# Patient Record
Sex: Female | Born: 1975 | Race: White | Hispanic: No | Marital: Married | Smoking: Former smoker
Health system: Southern US, Community
[De-identification: ages and names within clinical notes are randomized; demographics above are authoritative.]

## PROBLEM LIST (undated history)

## (undated) DIAGNOSIS — M1990 Unspecified osteoarthritis, unspecified site: Secondary | ICD-10-CM

## (undated) DIAGNOSIS — M5126 Other intervertebral disc displacement, lumbar region: Secondary | ICD-10-CM

## (undated) DIAGNOSIS — M51369 Other intervertebral disc degeneration, lumbar region without mention of lumbar back pain or lower extremity pain: Secondary | ICD-10-CM

## (undated) DIAGNOSIS — M5136 Other intervertebral disc degeneration, lumbar region: Secondary | ICD-10-CM

## (undated) DIAGNOSIS — N649 Disorder of breast, unspecified: Secondary | ICD-10-CM

## (undated) DIAGNOSIS — R51 Headache: Secondary | ICD-10-CM

## (undated) DIAGNOSIS — G589 Mononeuropathy, unspecified: Secondary | ICD-10-CM

## (undated) HISTORY — DX: Other intervertebral disc degeneration, lumbar region: M51.36

## (undated) HISTORY — PX: BREAST SURGERY: SHX581

## (undated) HISTORY — PX: OTHER SURGICAL HISTORY: SHX169

## (undated) HISTORY — PX: BREAST ENHANCEMENT SURGERY: SHX7

## (undated) HISTORY — PX: HAND RECONSTRUCTION: SHX1730

## (undated) HISTORY — DX: Disorder of breast, unspecified: N64.9

## (undated) HISTORY — PX: EXTERNAL EAR SURGERY: SHX627

## (undated) HISTORY — DX: Other intervertebral disc displacement, lumbar region: M51.26

## (undated) HISTORY — DX: Other intervertebral disc degeneration, lumbar region without mention of lumbar back pain or lower extremity pain: M51.369

## (undated) HISTORY — DX: Headache: R51

## (undated) HISTORY — DX: Mononeuropathy, unspecified: G58.9

---

## 2002-04-23 ENCOUNTER — Ambulatory Visit (HOSPITAL_COMMUNITY): Admission: RE | Admit: 2002-04-23 | Discharge: 2002-04-23 | Payer: Self-pay | Admitting: Obstetrics and Gynecology

## 2002-04-23 ENCOUNTER — Encounter: Payer: Self-pay | Admitting: Obstetrics and Gynecology

## 2003-04-24 ENCOUNTER — Ambulatory Visit (HOSPITAL_COMMUNITY): Admission: RE | Admit: 2003-04-24 | Discharge: 2003-04-24 | Payer: Self-pay | Admitting: Obstetrics and Gynecology

## 2006-04-09 ENCOUNTER — Inpatient Hospital Stay (HOSPITAL_COMMUNITY): Admission: RE | Admit: 2006-04-09 | Discharge: 2006-04-11 | Payer: Self-pay | Admitting: Obstetrics and Gynecology

## 2007-12-04 ENCOUNTER — Other Ambulatory Visit: Admission: RE | Admit: 2007-12-04 | Discharge: 2007-12-04 | Payer: Self-pay | Admitting: Obstetrics and Gynecology

## 2010-06-05 ENCOUNTER — Encounter: Payer: Self-pay | Admitting: Family Medicine

## 2010-06-05 ENCOUNTER — Encounter: Payer: Self-pay | Admitting: Obstetrics and Gynecology

## 2010-09-30 NOTE — Op Note (Signed)
NAME:  Natasha King, Natasha King NO.:  [id number]   MEDICAL RECORD NO.:  [id number]          PATIENT TYPE:  INP   LOCATION:  A412                          FACILITY:  [facility]   PHYSICIAN:  Tilda Burrow, M.D. DATE OF BIRTH:  [birth year]   DATE OF PROCEDURE:  DATE OF DISCHARGE:                               OPERATIVE REPORT   PREOPERATIVE DIAGNOSES:  Pregnancy 39 weeks, repeat cesarean section,  not for trial of labor.   POSTOPERATIVE DIAGNOSES:  Pregnancy 39 weeks, repeat cesarean section,  not for trial of labor.   OPERATION:  Repeat low transverse cervical cesarean section.   SURGEON:  Tilda Burrow, MD.   ASSISTANT:  __________ .   ANESTHESIA:  Spinal, __________ CRNA.   COMPLICATIONS:  None.   FINDINGS:  Healthy infant, Apgars 9 and 9.   INDICATIONS:  The patient is a 35 year old female with prior cesarean  section for fetal distress admitted for repeat cesarean section after  term gestation. Notable for normal level 2 scan at [facility].   PROCEDURE:  The patient was taken to the operating room and prepped and  draped for lower abdominal surgery. A Pfannenstiel incision was repeated  with excision of the old cicatrix and transverse opening of the fascia  in a standard Pfannenstiel technique. There was lots of firm fibrosis  from the fascia to the muscles underneath and this required careful  dissection. The rectus muscles could be split in the midline. Peritoneum  was opened without difficulty. The bladder flap was multiply adherent to  the lower uterine segment and was able to be taken down sharply and  normal anatomy restored. The bladder flap was then developed and  transverse uterine incision performed. This extended laterally using  finger traction. The fetal vertex was guided through the incision using  vacuum extractor and fundal pressure applied. The right arm was  delivered first, no traction on the head was performed after the  delivery of the head.  The cord was clamped and the infant placed under  Dr. Samul Dada care. See his notes for further details. Apgars 9 and 9  were assigned. Post delivery of the infant, we obtained cord blood gases  and delivered the placenta using Crede massage of the uterus and then  closed the uterus after antibiotic irrigation of the internal uterine  cavity. The transverse incision was closed using running locking 0  chromic. Bladder flap was reapproximated loosely using running 2-0  chromic. The peritoneum was irrigated with antibiotic solution, some  left inside. The anterior peritoneum was closed with running 2-0 chromic  and the fascia closed with 0 Vicryl. The rectus muscles had been pulled  together with a single stitch overlying the bladder. After closing the  fascia with  continuous running 0 Vicryl, the subcutaneous tissues were inspected and  found to be hemostatic after cautery was used in a couple of spots, then  pulled together using 2-0 chromic. Staple closure of the skin completed  the procedure. Estimated blood loss was 400 cc.      Tilda Burrow, M.D.  Electronically Signed     JVF/MEDQ  D:  04/09/2006  T:  04/09/2006  Job:  [id number]   cc:   Jeoffrey Massed, MD  Fax: [phone]

## 2010-09-30 NOTE — H&P (Signed)
NAME:  ISATU, MACINNES NO.:  [id number]   MEDICAL RECORD NO.:  [id number]          PATIENT TYPE:  AMB   LOCATION:  DAY                           FACILITY:  [facility]   PHYSICIAN:  Tilda Burrow, M.D. DATE OF BIRTH:  24-Aug-1975   DATE OF ADMISSION:  04/09/2006  DATE OF DISCHARGE:  LH                              HISTORY & PHYSICAL   ADMISSION DIAGNOSES:  1. Pregnancy at 39-weeks gestation; repeat cesarean section, not for      trial of labor.  2. History of first infant with syndactyly.   HISTORY OF PRESENT ILLNESS:  This 35 year old female gravida 2, para 1,  AB0, due April 16, 2006 by ultrasound and menstrual history, is  admitted on April 09, 2006 at [redacted] weeks gestation for repeat cesarean  section. She has a prior history of C-section for failure to progress,  for an 8 pound, 7 ounce infant in 1998. 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 It is the only known case  of it and is not a recognized syndrome. She is not felt to be at  increased risk of recurrence. She was admitted for repeat cesarean  section.   PAST MEDICAL HISTORY:  Benign surgical history.   ALLERGIES:  None.   SOCIAL HISTORY:  Married, stable relationship. Plans breast feeding and  oral contraceptive use in the future. Does not plan to have her tubes  tied any time soon.   PHYSICAL EXAMINATION:  VITAL SIGNS: Height 5 feet, 2-12/2 inches. Weight  181 which is a 22 pound weight gain. Blood pressure 142/84.  HEENT: Pupils are equal, round and reactive. Extraocular movements are  intact.  NECK: Supple.  CHEST: Clear to auscultation.  ABDOMEN: Nontender. Fundal height 37 cm. Vertex presentation.   LABS:  Blood type O positive. Urine drug screen negative. Rubella  immune. Hemoglobin 13, hematocrit 40. Hepatitis, HIV, RPR, GC/Chlamydia  as well as HSV2 all negative. Pap smear is normal.  Glucose tolerance  test  elevated at 167 mg % with a normal 3 hour test. Her husband, Cristal Deer  is supportive with this child. She plans spinal anesthesia; breast  feeding.   PLAN:  Repeat C-section on April 09, 2006.      Tilda Burrow, M.D.  Electronically Signed     JVF/MEDQ  D:  04/04/2006  T:  04/04/2006  Job:  [id number]   cc:   Rosalio Macadamia  Fax: [phone]   [facility]

## 2010-09-30 NOTE — Discharge Summary (Signed)
NAME:  Natasha King, Natasha King NO.:  [id number]   MEDICAL RECORD NO.:  [id number]          PATIENT TYPE:  INP   LOCATION:  A412                          FACILITY:  [facility]   PHYSICIAN:  Tilda Burrow, M.D. DATE OF BIRTH:  02/05/1976   DATE OF ADMISSION:  04/09/2006  DATE OF DISCHARGE:  11/28/2007LH                               DISCHARGE SUMMARY   ADMISSION DIAGNOSIS:  Pregnancy at 39 weeks with cesarean section, not  for trial of labor.   DISCHARGE DIAGNOSIS:  Pregnancy at 39 weeks with cesarean section, not  for trial of labor, delivered.   PROCEDURE:  April 09, 2006, repeat low transverse cervical cesarean  section, Tilda Burrow, M.D.   DISCHARGE MEDICATIONS:  1. Tylox 30 tablets one p.o. q.6h. p.r.n. pain.  2. Chromogen Forte 60 tablets one p.o. b.i.d. x30 days.   FOLLOW UP:  One week for staple removal.   HOSPITAL SUMMARY:  This is a 35 year old female gravida 2, para 1 who  was admitted for repeat cesarean section.  She did not require tubal  sterilization.   HOSPITAL COURSE:  See HPI for admitting status.  Blood type was O  positive, hemoglobin 12.6, hematocrit 36.7 at admission.  She had an  uneventful C. section with postoperative hemoglobin 10, hematocrit 30.  Incision looked great.  Diet was resumed within 6 hours post surgery and  the patient went home in excellent condition with incision looking  great, for staple removal in one week's time.  Routine postoperative  instructions given.      Tilda Burrow, M.D.  Electronically Signed     JVF/MEDQ  D:  04/11/2006  T:  04/11/2006  Job:  [id number]   cc:   [facility]

## 2011-09-20 ENCOUNTER — Other Ambulatory Visit: Payer: Self-pay | Admitting: Obstetrics and Gynecology

## 2011-09-20 ENCOUNTER — Other Ambulatory Visit (HOSPITAL_COMMUNITY)
Admission: RE | Admit: 2011-09-20 | Discharge: 2011-09-20 | Disposition: A | Discharge status: Home / Self Care | Payer: Medicaid Other | Source: Ambulatory Visit | Attending: Obstetrics and Gynecology | Admitting: Obstetrics and Gynecology

## 2011-09-20 DIAGNOSIS — Z113 Encounter for screening for infections with a predominantly sexual mode of transmission: Secondary | ICD-10-CM | POA: Insufficient documentation

## 2011-09-20 DIAGNOSIS — N6320 Unspecified lump in the left breast, unspecified quadrant: Secondary | ICD-10-CM

## 2011-09-20 DIAGNOSIS — Z01419 Encounter for gynecological examination (general) (routine) without abnormal findings: Secondary | ICD-10-CM | POA: Insufficient documentation

## 2011-10-04 ENCOUNTER — Other Ambulatory Visit (HOSPITAL_COMMUNITY): Payer: Self-pay | Admitting: Obstetrics and Gynecology

## 2011-10-04 ENCOUNTER — Ambulatory Visit (HOSPITAL_COMMUNITY)
Admission: RE | Admit: 2011-10-04 | Discharge: 2011-10-04 | Disposition: A | Discharge status: Home / Self Care | Payer: Medicaid Other | Source: Ambulatory Visit | Attending: Obstetrics and Gynecology | Admitting: Obstetrics and Gynecology

## 2011-10-04 ENCOUNTER — Other Ambulatory Visit: Payer: Self-pay | Admitting: Obstetrics and Gynecology

## 2011-10-04 DIAGNOSIS — Z803 Family history of malignant neoplasm of breast: Secondary | ICD-10-CM | POA: Insufficient documentation

## 2011-10-04 DIAGNOSIS — N6320 Unspecified lump in the left breast, unspecified quadrant: Secondary | ICD-10-CM

## 2011-10-04 DIAGNOSIS — N63 Unspecified lump in unspecified breast: Secondary | ICD-10-CM | POA: Insufficient documentation

## 2011-10-04 DIAGNOSIS — Q838 Other congenital malformations of breast: Secondary | ICD-10-CM | POA: Insufficient documentation

## 2012-08-30 ENCOUNTER — Emergency Department (HOSPITAL_COMMUNITY)
Admission: EM | Admit: 2012-08-30 | Discharge: 2012-08-30 | Disposition: A | Discharge status: Home / Self Care | Payer: Medicaid Other | Attending: Emergency Medicine | Admitting: Emergency Medicine

## 2012-08-30 ENCOUNTER — Encounter (HOSPITAL_COMMUNITY): Payer: Self-pay | Admitting: *Deleted

## 2012-08-30 DIAGNOSIS — F17200 Nicotine dependence, unspecified, uncomplicated: Secondary | ICD-10-CM | POA: Insufficient documentation

## 2012-08-30 DIAGNOSIS — M129 Arthropathy, unspecified: Secondary | ICD-10-CM | POA: Insufficient documentation

## 2012-08-30 DIAGNOSIS — M5412 Radiculopathy, cervical region: Secondary | ICD-10-CM | POA: Insufficient documentation

## 2012-08-30 DIAGNOSIS — G8921 Chronic pain due to trauma: Secondary | ICD-10-CM | POA: Insufficient documentation

## 2012-08-30 HISTORY — DX: Unspecified osteoarthritis, unspecified site: M19.90

## 2012-08-30 MED ORDER — OXYCODONE-ACETAMINOPHEN 5-325 MG PO TABS
1.0000 | ORAL_TABLET | Freq: Once | ORAL | Status: AC
  Administered 2012-08-30: 1 via ORAL
  Filled 2012-08-30: qty 1

## 2012-08-30 MED ORDER — OXYCODONE-ACETAMINOPHEN 5-325 MG PO TABS: 1.0000 | ORAL_TABLET | ORAL | Status: DC | PRN

## 2012-08-30 MED ORDER — CYCLOBENZAPRINE HCL 10 MG PO TABS: 10.0000 mg | ORAL_TABLET | Freq: Three times a day (TID) | ORAL | Status: DC | PRN

## 2012-08-30 NOTE — ED Notes (Signed)
Pain neck and rt shoulder for 3 weeks.  Injury to neck and shoulder 3 weeks ago when pushing her grandmother in w/c. Pt was x-rayd at [facility] and treated by MD in [place].  Out of percocet.

## 2012-09-01 NOTE — ED Provider Notes (Signed)
History     CSN: [id number]  Arrival date & time 08/30/12  1554   First MD Initiated Contact with Patient 08/30/12 1649      Chief Complaint  Patient presents with  . Neck Pain    (Consider location/radiation/quality/duration/timing/severity/associated sxs/prior treatment) HPI Comments: Patient c/o pain to her right neck for 3 weeks.  Pain began after she was pushing her grandmother in a wheelchair.  States she felt a sharp pain to her neck and shoulder that has been present since onset.  She has been seeing her PMD for the injury but has ran out of her medication.  She also states she is waiting for her PMD to arrange a MRI of her neck.  She denies headaches, dizziness, neck stiffness, numbness or weakness of the UE's.    Patient is a 37 y.o. female presenting with neck pain. The history is provided by the patient.  Neck Pain Pain location:  R side Quality:  Aching Pain radiates to:  R shoulder Pain severity:  Moderate Pain is:  Same all the time Onset quality:  Sudden Duration:  3 weeks Timing:  Constant Progression:  Unchanged Chronicity:  Chronic Context: lifting a heavy object and recent injury   Relieved by:  Nothing Worsened by:  Bending, twisting and position Ineffective treatments:  Heat and ice Associated symptoms: no bladder incontinence, no bowel incontinence, no chest pain, no fever, no headaches, no leg pain, no numbness, no paresis, no photophobia, no syncope, no tingling, no visual change and no weakness     Past Medical History  Diagnosis Date  . Arthritis     Past Surgical History  Procedure Laterality Date  . Breast surgery    . Breast enhancement surgery      History reviewed. No pertinent family history.  History  Substance Use Topics  . Smoking status: Current Every Day Smoker  . Smokeless tobacco: Not on file  . Alcohol Use: No    OB History   Grav Para Term Preterm Abortions TAB SAB Ect Mult Living                  Review of  Systems  Constitutional: Negative for fever and chills.  HENT: Positive for neck pain. Negative for neck stiffness.   Eyes: Negative for photophobia.  Cardiovascular: Negative for chest pain and syncope.  Gastrointestinal: Negative for bowel incontinence.  Genitourinary: Negative for bladder incontinence, dysuria and difficulty urinating.  Musculoskeletal: Positive for arthralgias. Negative for back pain and joint swelling.  Skin: Negative for color change and wound.  Neurological: Negative for dizziness, tingling, facial asymmetry, weakness, numbness and headaches.  Psychiatric/Behavioral: The patient is not nervous/anxious.   All other systems reviewed and are negative.    Allergies  Review of patient's allergies indicates no known allergies.  Home Medications   Current Outpatient Rx  Name  Route  Sig  Dispense  Refill  . cyclobenzaprine (FLEXERIL) 10 MG tablet   Oral   Take 10 mg by mouth 3 (three) times daily as needed for muscle spasms.         Marland Kitchen HYDROcodone-acetaminophen (NORCO/VICODIN) 5-325 MG per tablet   Oral   Take 1 tablet by mouth every 6 (six) hours as needed for pain.         . cyclobenzaprine (FLEXERIL) 10 MG tablet   Oral   Take 1 tablet (10 mg total) by mouth 3 (three) times daily as needed.   21 tablet   0   .  oxyCODONE-acetaminophen (PERCOCET/ROXICET) 5-325 MG per tablet   Oral   Take 1 tablet by mouth every 4 (four) hours as needed for pain.   10 tablet   0     BP 120/89  Pulse 102  Temp(Src) 97.5 F (36.4 C) (Oral)  Resp 20  Ht 5\' 2"  (1.575 m)  Wt 159 lb (72.122 kg)  BMI 29.07 kg/m2  SpO2 100%  LMP 08/16/2012  Physical Exam  Nursing note and vitals reviewed. Constitutional: She is oriented to person, place, and time. She appears well-developed and well-nourished. No distress.  HENT:  Head: Normocephalic and atraumatic.  Mouth/Throat: Oropharynx is clear and moist.  Eyes: EOM are normal. Pupils are equal, round, and reactive to  light.  Neck: Phonation normal. Muscular tenderness present. No spinous process tenderness present. No rigidity. Decreased range of motion present. No erythema present. No Brudzinski's sign and no Kernig's sign noted. No thyromegaly present.  ttp of the right cervical paraspinal muscles and trapezius muscles.  Grip strength is strong and equal bilaterally.  Distal sensation intact,  CR < 2 sec.  Pain reproduced with abduction of the right arm.  No bony tenderness or deformities of the shoulder joint.  Full ROM of the shoulder  Cardiovascular: Normal rate, regular rhythm, normal heart sounds and intact distal pulses.   No murmur heard. Pulmonary/Chest: Effort normal and breath sounds normal. No respiratory distress. She exhibits no tenderness.  Musculoskeletal: She exhibits tenderness. She exhibits no edema.       Right shoulder: She exhibits tenderness and pain. She exhibits normal range of motion, no bony tenderness, no swelling, no effusion, no crepitus, no deformity, normal pulse and normal strength.       Cervical back: She exhibits tenderness. She exhibits normal range of motion, no bony tenderness, no swelling, no deformity, no spasm and normal pulse.       Back:  See neck exam  Lymphadenopathy:    She has no cervical adenopathy.  Neurological: She is alert and oriented to person, place, and time. No cranial nerve deficit or sensory deficit. She exhibits normal muscle tone. Coordination normal.  Reflex Scores:      Tricep reflexes are 2+ on the right side and 2+ on the left side.      Bicep reflexes are 2+ on the right side and 2+ on the left side.      Brachioradialis reflexes are 2+ on the right side and 2+ on the left side. Skin: Skin is warm and dry.    ED Course  Procedures (including critical care time)  Labs Reviewed - No data to display No results found.   1. Cervical radicular pain       MDM    Patient with acute on chronic right neck pain.  No motor or sensory  deficits on exam.  Well appearing, VSS.  Agrees to f/u with her PMD on Monday.  Doubt emergent neurological or infectius process.  The patient appears reasonably screened and/or stabilized for discharge and I doubt any other medical condition or other [facility] requiring further screening, evaluation, or treatment in the ED at this time prior to discharge.       Yarrow Linhart L. Trisha Mangle, PA-C 09/01/12 1843

## 2012-09-04 NOTE — ED Provider Notes (Signed)
Medical screening examination/treatment/procedure(s) were performed by non-physician practitioner and as supervising physician I was immediately available for consultation/collaboration. Ademola Vert, MD, FACEP   Thayer Embleton L Sophea Rackham, MD 09/04/12 1049 

## 2012-10-28 ENCOUNTER — Encounter: Payer: Self-pay | Admitting: *Deleted

## 2012-10-29 ENCOUNTER — Other Ambulatory Visit: Payer: Self-pay | Admitting: Obstetrics & Gynecology

## 2012-11-22 ENCOUNTER — Ambulatory Visit (INDEPENDENT_AMBULATORY_CARE_PROVIDER_SITE_OTHER): Payer: Medicaid Other | Admitting: Obstetrics & Gynecology

## 2012-11-22 ENCOUNTER — Encounter: Payer: Self-pay | Admitting: Obstetrics & Gynecology

## 2012-11-22 ENCOUNTER — Other Ambulatory Visit (HOSPITAL_COMMUNITY)
Admission: RE | Admit: 2012-11-22 | Discharge: 2012-11-22 | Disposition: A | Discharge status: Home / Self Care | Payer: Medicaid Other | Source: Ambulatory Visit | Attending: Obstetrics & Gynecology | Admitting: Obstetrics & Gynecology

## 2012-11-22 VITALS — BP 120/80 | Ht 62.0 in | Wt 157.0 lb

## 2012-11-22 DIAGNOSIS — Z0000 Encounter for general adult medical examination without abnormal findings: Secondary | ICD-10-CM

## 2012-11-22 DIAGNOSIS — Z1151 Encounter for screening for human papillomavirus (HPV): Secondary | ICD-10-CM | POA: Insufficient documentation

## 2012-11-22 DIAGNOSIS — Z01419 Encounter for gynecological examination (general) (routine) without abnormal findings: Secondary | ICD-10-CM

## 2012-11-22 DIAGNOSIS — M5090 Cervical disc disorder, unspecified, unspecified cervical region: Secondary | ICD-10-CM | POA: Insufficient documentation

## 2012-11-22 NOTE — Progress Notes (Signed)
Patient ID: Natasha King, female   DOB: [birth year], 37 y.o.   MRN: [id number] Subjective:     Natasha King is a 37 y.o. female here for a routine exam.  Patient's last menstrual period was 10/29/2012. G2P2 Current complaints: neck issues.  Personal health questionnaire reviewed: no.   Gynecologic History Patient's last menstrual period was 10/29/2012. Contraception: condoms Last Pap: 2013. Results were: normal Last mammogram: 2013. Results were: normal  Obstetric History OB History   Grav Para Term Preterm Abortions TAB SAB Ect Mult Living   2 2        2      # Outc Date GA Lbr Len/2nd Wgt Sex Del Anes PTL Lv   1 PAR            2 PAR                The following portions of the patient's history were reviewed and updated as appropriate: allergies, current medications, past family history, past medical history, past social history, past surgical history and problem list.  Review of Systems  Review of Systems  Constitutional: Negative for fever, chills, weight loss, malaise/fatigue and diaphoresis.  HENT: Negative for hearing loss, ear pain, nosebleeds, congestion, sore throat, neck pain, tinnitus and ear discharge.   Eyes: Negative for blurred vision, double vision, photophobia, pain, discharge and redness.  Respiratory: Negative for cough, hemoptysis, sputum production, shortness of breath, wheezing and stridor.   Cardiovascular: Negative for chest pain, palpitations, orthopnea, claudication, leg swelling and PND.  Gastrointestinal: negative for abdominal pain. Negative for heartburn, nausea, vomiting, diarrhea, constipation, blood in stool and melena.  Genitourinary: Negative for dysuria, urgency, frequency, hematuria and flank pain.  Musculoskeletal: Negative for myalgias, back pain, joint pain and falls.  Skin: Negative for itching and rash.  Neurological: Negative for dizziness, tingling, tremors, sensory change, speech change, focal weakness, seizures, loss of  consciousness, weakness and headaches.  Endo/Heme/Allergies: Negative for environmental allergies and polydipsia. Does not bruise/bleed easily.  Psychiatric/Behavioral: Negative for depression, suicidal ideas, hallucinations, memory loss and substance abuse. The patient is not nervous/anxious and does not have insomnia.        Objective:    Physical Exam  Vitals reviewed. Constitutional: She is oriented to person, place, and time. She appears well-developed and well-nourished.  HENT:  Head: Normocephalic and atraumatic.        Right Ear: External ear normal.  Left Ear: External ear normal.  Nose: Nose normal.  Mouth/Throat: Oropharynx is clear and moist.  Eyes: Conjunctivae and EOM are normal. Pupils are equal, round, and reactive to light. Right eye exhibits no discharge. Left eye exhibits no discharge. No scleral icterus.  Neck: Normal range of motion. Neck supple. No tracheal deviation present. No thyromegaly present.  Cardiovascular: Normal rate, regular rhythm, normal heart sounds and intact distal pulses.  Exam reveals no gallop and no friction rub.   No murmur heard. Breasts:  No masses or tenderness noted, right breast has implant only Respiratory: Effort normal and breath sounds normal. No respiratory distress. She has no wheezes. She has no rales. She exhibits no tenderness.  GI: Soft. Bowel sounds are normal. She exhibits no distension and no mass. There is no tenderness. There is no rebound and no guarding.  Genitourinary:       Vulva is normal without lesions Vagina is pink moist without discharge Cervix normal in appearance and pap is done Uterus is normal size shape and contour Adnexa is negative with normal  sized ovaries   Musculoskeletal: Normal range of motion. She exhibits no edema and no tenderness.  Neurological: She is alert and oriented to person, place, and time. She has normal reflexes. She displays normal reflexes. No cranial nerve deficit. She exhibits normal  muscle tone. Coordination normal.  Skin: Skin is warm and dry. No rash noted. No erythema. No pallor.  Psychiatric: She has a normal mood and affect. Her behavior is normal. Judgment and thought content normal.       Assessment:    Healthy female exam.    Plan:    Mammogram ordered. Follow up in: 1 year.

## 2012-11-22 NOTE — Patient Instructions (Signed)

## 2012-11-26 NOTE — Addendum Note (Signed)
Addended by: Criss Alvine on: 11/26/2012 11:46 AM   Modules accepted: Orders

## 2013-01-22 ENCOUNTER — Telehealth: Payer: Self-pay | Admitting: Adult Health

## 2013-01-22 NOTE — Telephone Encounter (Signed)
Pt states menstrual cycle usually like clock work, now "feels like she is slowly bleeding to death" Call transferred to front staff for an appt.

## 2013-01-31 ENCOUNTER — Ambulatory Visit: Payer: Medicaid Other | Admitting: Obstetrics and Gynecology

## 2013-02-13 ENCOUNTER — Ambulatory Visit: Payer: Medicaid Other | Admitting: Obstetrics and Gynecology

## 2013-07-15 IMAGING — MG MM DIGITAL DIAGNOSTIC BILAT
3 series · 3 of 3 positions shown · non-contrast
Comparison: 02/18/2003

CLINICAL DATA: The patient feels a lump in the 1 o'clock position
of the left breast.  The patient was born without a right breast or
pectoralis muscle. Her mother was diagnosed with breast cancer at
age 38.  She has a maternal great aunt who has had breast cancer.

DIGITAL DIAGNOSTIC LEFT MAMMOGRAM WITH CAD AND LEFT BREAST
ULTRASOUND:

[L CC]
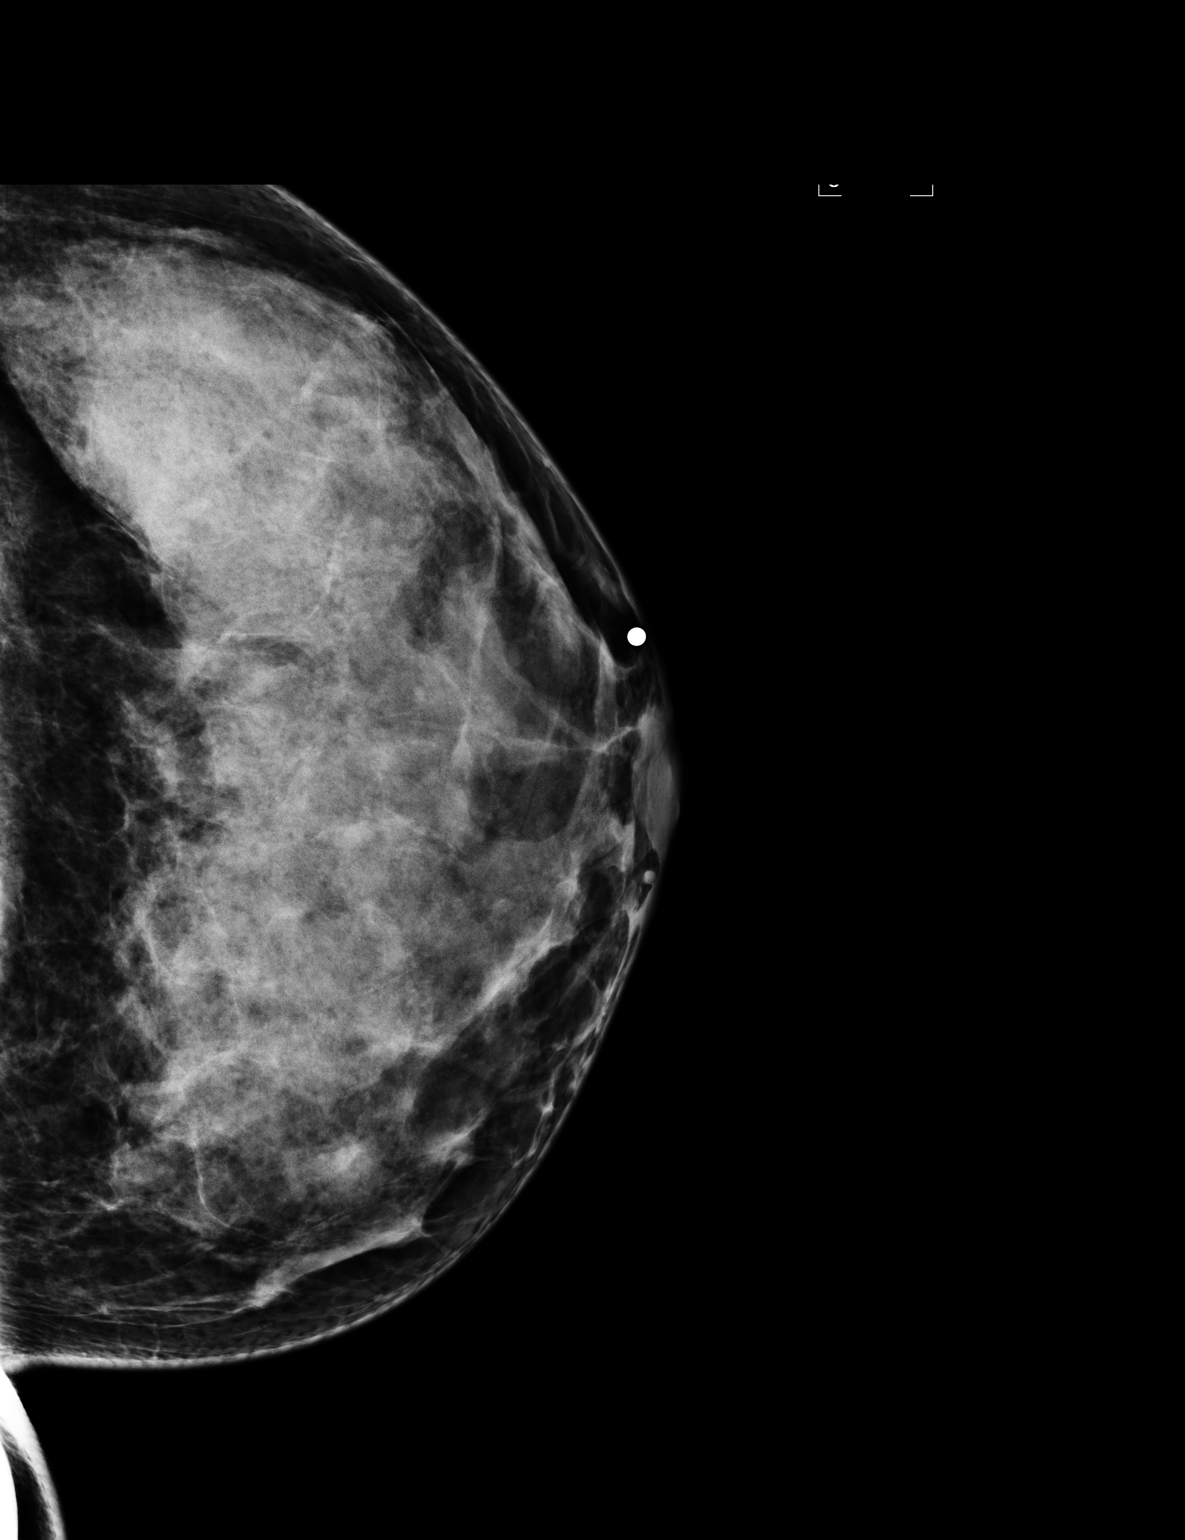

[L MLO]
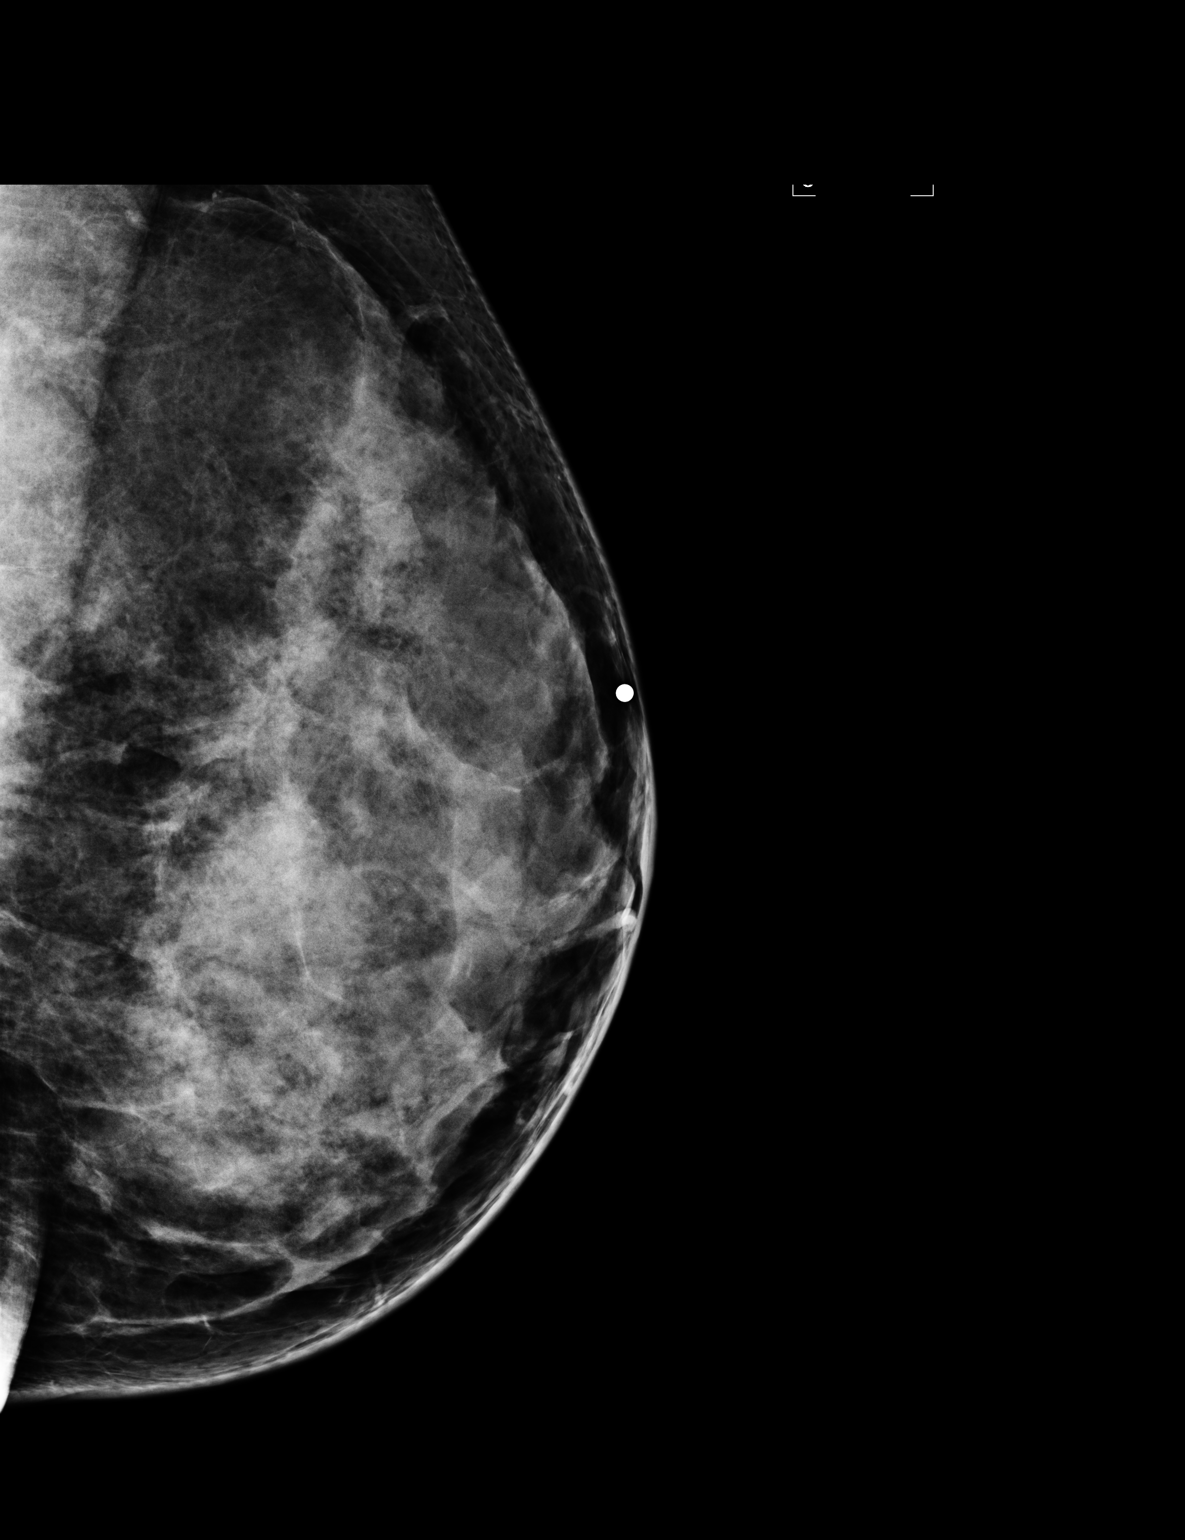

[L TAN]
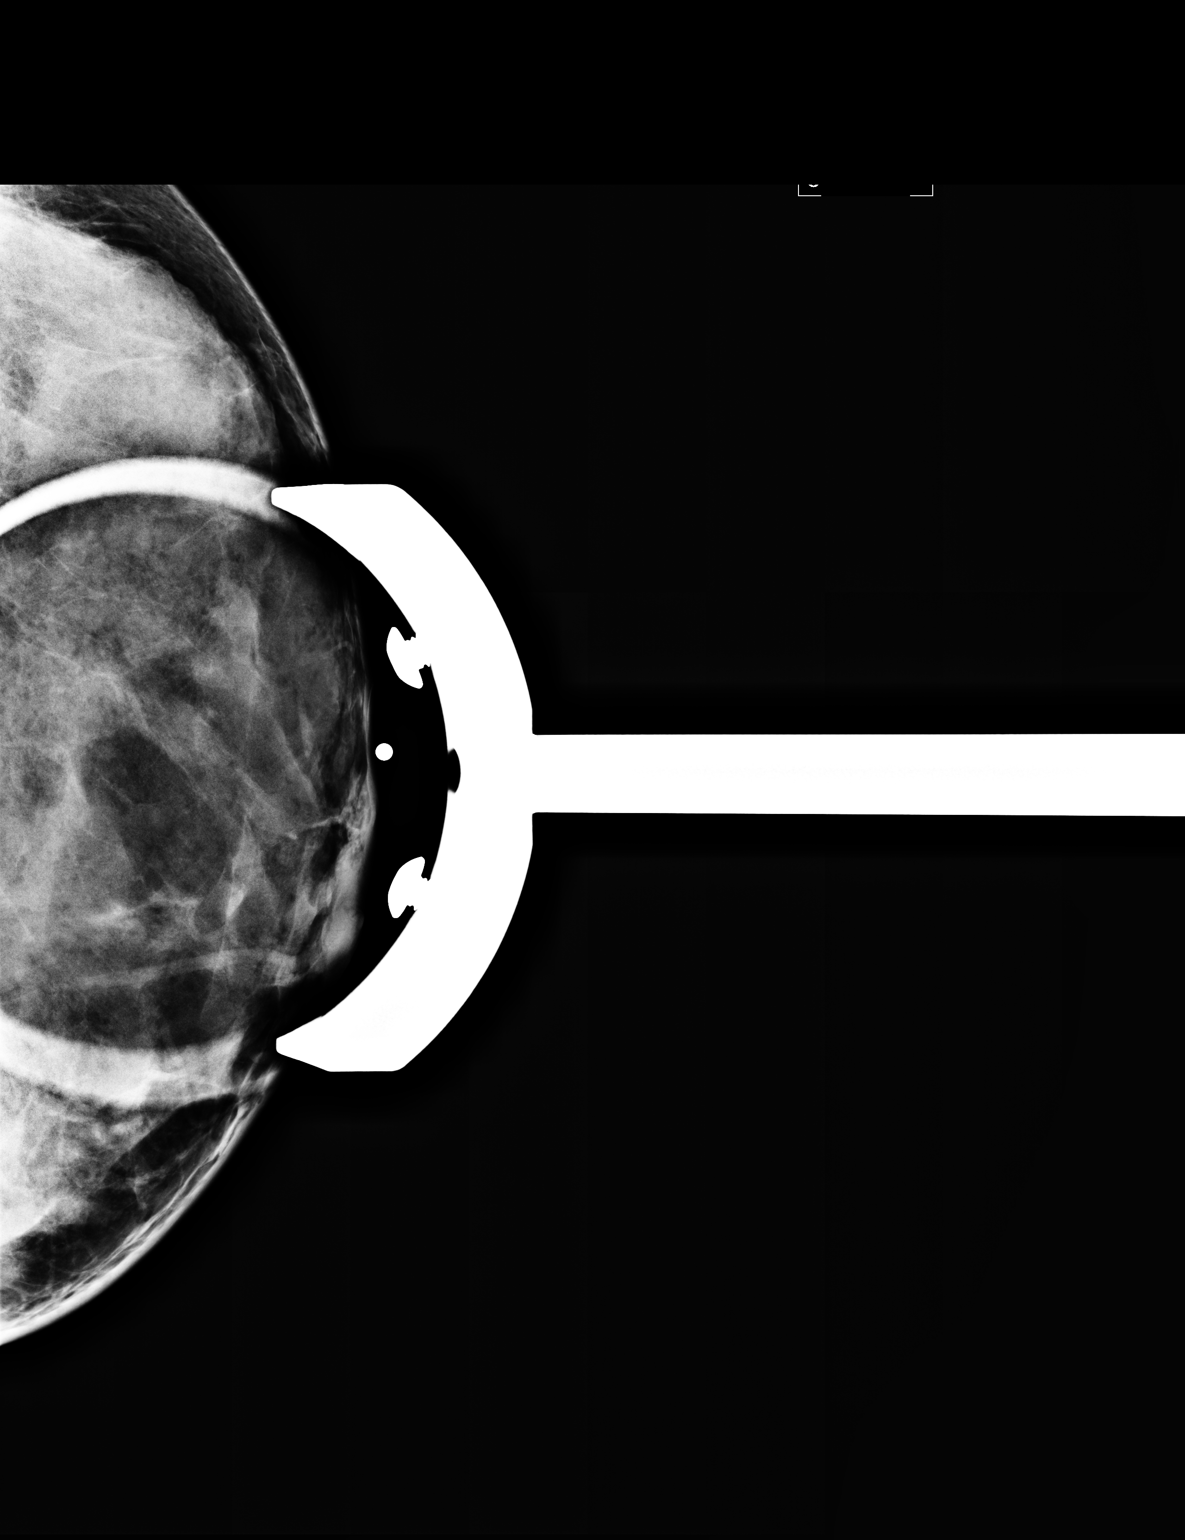

[3 of 3 positions shown; findings below may reference images not displayed]

FINDINGS: The breast tissue is extremely dense.  There is no
dominant mass, architectural distortion or calcification to suggest
malignancy.
Mammographic images were processed with CAD.

On physical exam, no mass is palpated in the upper portion of the
left breast.  The patient points to the area of concern as being at
1 o'clock 3 cm from the left nipple.

Ultrasound is performed, showing a ridge of dense fibroglandular
tissue at 1 o'clock 3 cm from the left nipple with no mass,
distortion or shadowing to suggest malignancy.
IMPRESSION: No mammographic or sonographic evidence of malignancy.  Yearly
screening mammography is suggested.

BI-RADS CATEGORY 1:  Negative.

## 2014-03-16 ENCOUNTER — Encounter: Payer: Self-pay | Admitting: Obstetrics & Gynecology

## 2014-06-09 ENCOUNTER — Other Ambulatory Visit: Payer: Medicaid Other | Admitting: Adult Health

## 2014-06-16 ENCOUNTER — Encounter: Payer: Self-pay | Admitting: Adult Health

## 2014-06-16 ENCOUNTER — Ambulatory Visit (INDEPENDENT_AMBULATORY_CARE_PROVIDER_SITE_OTHER): Payer: Medicaid Other | Admitting: Adult Health

## 2014-06-16 VITALS — BP 120/82 | HR 72 | Ht 62.0 in | Wt 166.0 lb

## 2014-06-16 DIAGNOSIS — Z139 Encounter for screening, unspecified: Secondary | ICD-10-CM

## 2014-06-16 DIAGNOSIS — Z0000 Encounter for general adult medical examination without abnormal findings: Secondary | ICD-10-CM

## 2014-06-16 DIAGNOSIS — Z01419 Encounter for gynecological examination (general) (routine) without abnormal findings: Secondary | ICD-10-CM

## 2014-06-16 DIAGNOSIS — Z1212 Encounter for screening for malignant neoplasm of rectum: Secondary | ICD-10-CM

## 2014-06-16 LAB — HEMOCCULT GUIAC POC 1CARD (OFFICE): FECAL OCCULT BLD: NEGATIVE

## 2014-06-16 NOTE — Progress Notes (Signed)
Patient ID: Natasha King, female   DOB: 04/10/1976, 39 y.o.   MRN: [id number] History of Present Illness: [name] is a 39 year old white female in for gyn exam.She had a normal pap with negative HPV 11/22/12.   Current Medications, Allergies, Past Medical History, Past Surgical History, Family History and Social History were reviewed in [employer] record.     Review of Systems: Patient denies any headaches, blurred vision, shortness of breath, chest pain, abdominal pain, problems with bowel movements, urination, or intercourse. No joint pain but has cervical disc problems and sees MD in [place], no mood swings, periods regular, is using condoms.    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 Cardiovascular: Regular rate and rhythm Abdomen:  Soft, non tender, no hepatosplenomegaly Pelvic:  External genitalia is normal in appearance, no lesions.  The vagina is normal in appearance.      The cervix is bulbous.  Uterus is felt to be normal size, shape, and contour.  No   adnexal masses or tenderness noted. Rectal: Good sphincter tone, no polyps, or hemorrhoids felt.  Hemoccult negative. Extremities:  No swelling or varicosities noted Psych:  No mood changes,alert and cooperative,seems happy Dicussed peri menopause, and decreasing cigarettes.   Impression: Well woman gyn exam no pap    Plan: Mammogram scheduled for her 2/5 at 9:30 and at [place] and physical in 1 year Labs with Dr Quintin Alto

## 2014-06-16 NOTE — Patient Instructions (Addendum)
Pap and physical in 1 year Mammogram now and yearly  2/5 at 9:30 Labs with Dr Quintin Alto

## 2014-06-19 ENCOUNTER — Ambulatory Visit (HOSPITAL_COMMUNITY)
Admission: RE | Admit: 2014-06-19 | Discharge: 2014-06-19 | Disposition: A | Discharge status: Home / Self Care | Payer: Medicaid Other | Source: Ambulatory Visit | Attending: Adult Health | Admitting: Adult Health

## 2014-06-19 DIAGNOSIS — Z1231 Encounter for screening mammogram for malignant neoplasm of breast: Secondary | ICD-10-CM | POA: Diagnosis not present

## 2014-06-19 DIAGNOSIS — Z139 Encounter for screening, unspecified: Secondary | ICD-10-CM

## 2016-03-30 IMAGING — MG MM DIGITAL SCREENING UNILAT L
1 series · 1 of 1 positions shown · non-contrast
Comparison: Previous exam(s).

CLINICAL DATA: Screening.

EXAM:
DIGITAL SCREENING UNILATERAL LEFT MAMMOGRAM WITH CAD

[L MLO]
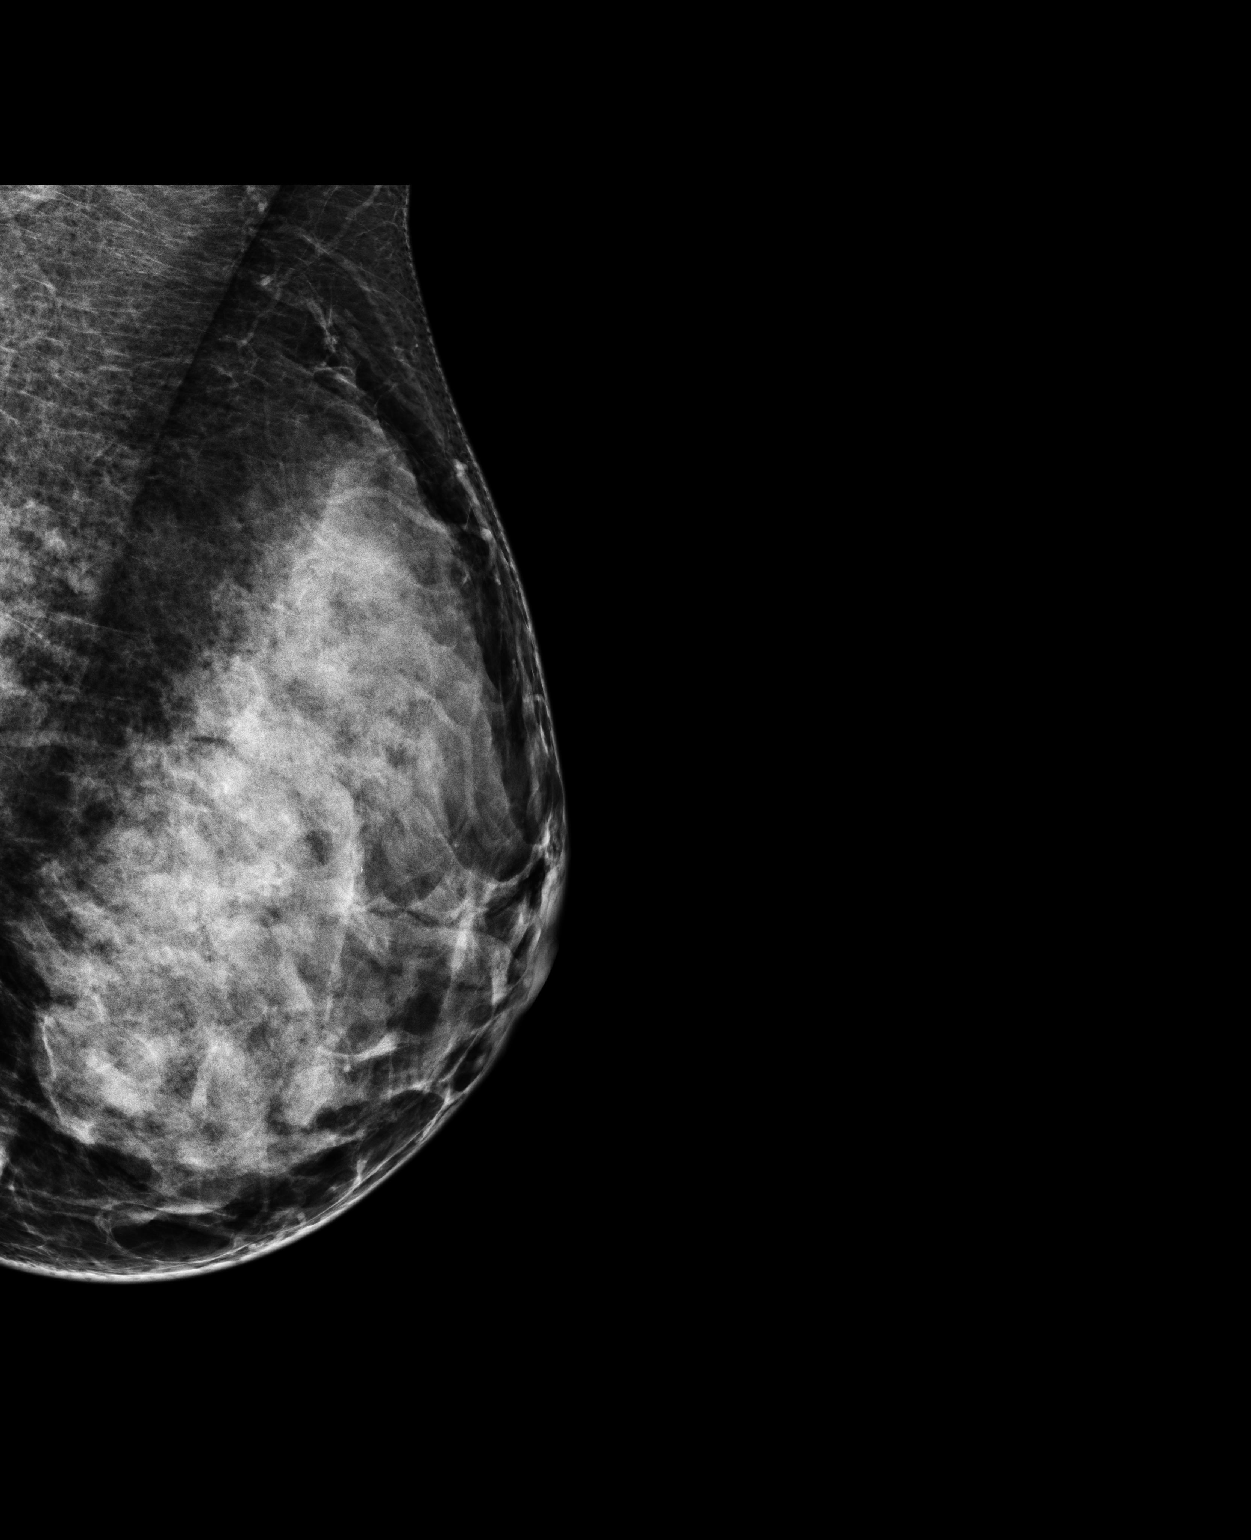

[1 of 1 positions shown; findings below may reference images not displayed]

ACR Breast Density Category d: The breast tissue is extremely dense,
which lowers the sensitivity of mammography.
FINDINGS: There are no findings suspicious for malignancy. Images were
processed with CAD.
IMPRESSION: No mammographic evidence of malignancy. A result letter of this
screening mammogram will be mailed directly to the patient.

RECOMMENDATION:
Screening mammogram in one year. (Code:8A-I-20P)

BI-RADS CATEGORY  1: Negative.

## 2020-04-26 ENCOUNTER — Other Ambulatory Visit (HOSPITAL_COMMUNITY): Payer: Self-pay | Admitting: Gerontology

## 2020-04-26 ENCOUNTER — Other Ambulatory Visit: Payer: Self-pay

## 2020-04-26 ENCOUNTER — Other Ambulatory Visit (HOSPITAL_COMMUNITY): Payer: Self-pay | Admitting: Internal Medicine

## 2020-04-26 ENCOUNTER — Ambulatory Visit (HOSPITAL_COMMUNITY)
Admission: RE | Admit: 2020-04-26 | Discharge: 2020-04-26 | Disposition: A | Discharge status: Home / Self Care | Payer: Self-pay | Source: Ambulatory Visit | Attending: Gerontology | Admitting: Gerontology

## 2020-04-26 DIAGNOSIS — J40 Bronchitis, not specified as acute or chronic: Secondary | ICD-10-CM

## 2020-04-26 DIAGNOSIS — Z1231 Encounter for screening mammogram for malignant neoplasm of breast: Secondary | ICD-10-CM

## 2020-04-29 ENCOUNTER — Ambulatory Visit (HOSPITAL_COMMUNITY): Payer: Self-pay

## 2020-05-24 ENCOUNTER — Encounter (HOSPITAL_COMMUNITY): Payer: Self-pay

## 2020-05-24 ENCOUNTER — Other Ambulatory Visit: Payer: Self-pay

## 2020-05-24 ENCOUNTER — Ambulatory Visit (HOSPITAL_COMMUNITY)
Admission: RE | Admit: 2020-05-24 | Discharge: 2020-05-24 | Disposition: A | Discharge status: Home / Self Care | Payer: Self-pay | Source: Ambulatory Visit | Attending: Internal Medicine | Admitting: Internal Medicine

## 2020-05-24 DIAGNOSIS — Z1231 Encounter for screening mammogram for malignant neoplasm of breast: Secondary | ICD-10-CM | POA: Insufficient documentation

## 2021-04-14 ENCOUNTER — Encounter: Payer: Self-pay | Admitting: Obstetrics & Gynecology

## 2021-04-14 ENCOUNTER — Ambulatory Visit (INDEPENDENT_AMBULATORY_CARE_PROVIDER_SITE_OTHER): Payer: Managed Care, Other (non HMO) | Admitting: Obstetrics & Gynecology

## 2021-04-14 ENCOUNTER — Other Ambulatory Visit: Payer: Self-pay

## 2021-04-14 ENCOUNTER — Other Ambulatory Visit (HOSPITAL_COMMUNITY)
Admission: RE | Admit: 2021-04-14 | Discharge: 2021-04-14 | Disposition: A | Discharge status: Home / Self Care | Payer: Managed Care, Other (non HMO) | Source: Ambulatory Visit | Attending: Obstetrics & Gynecology | Admitting: Obstetrics & Gynecology

## 2021-04-14 VITALS — BP 139/86 | HR 93 | Ht 62.5 in | Wt 168.2 lb

## 2021-04-14 DIAGNOSIS — Z01419 Encounter for gynecological examination (general) (routine) without abnormal findings: Secondary | ICD-10-CM | POA: Diagnosis not present

## 2021-04-14 DIAGNOSIS — Z01411 Encounter for gynecological examination (general) (routine) with abnormal findings: Secondary | ICD-10-CM

## 2021-04-14 DIAGNOSIS — N939 Abnormal uterine and vaginal bleeding, unspecified: Secondary | ICD-10-CM | POA: Diagnosis not present

## 2021-04-14 MED ORDER — NORETHINDRONE 0.35 MG PO TABS: 1.0000 | ORAL_TABLET | Freq: Every day | ORAL | 0 refills | Status: DC

## 2021-04-14 NOTE — Progress Notes (Signed)
WELL-WOMAN EXAMINATION Patient name: Natasha King MRN [id number]  Date of birth: Dec 24, 1975 Chief Complaint:   New Patient (Initial Visit) and Gynecologic Exam  History of Present Illness:   Natasha King is a 45 y.o. G82P2002 female being seen today for a routine well-woman exam.  Today she notes: the following concerns:  AUB: Menses last for 7 days- at time menses are every 2-3 weeks.  Using super plus tampons every 1-2 hours for the first several days.  +Dysmenorrhea as well as migraines.  Will also take OTC Ibuprofen/tylenol with minimal improvement.  Notes significant improvement with muscle relaxer.  On occasion will take imitrex, but didn't feel like that was helping.  Denies intermenstrual bleeding. Denies vaginal discharge, itching or odor. Denies pelvic pain outside of her period.  Occasional hot flash, denies night sweats.  Anxiety: Busy with work and her grandmother recently fell ill.  Reports not sleeping for the past 3 nights.  Patient's last menstrual period was 04/06/2021 (exact date).  The current method of family planning is none.    Last pap years ago.  Last mammogram: 05/2020. Last colonoscopy: n/a  Depression screen [facility] 2/9 04/14/2021  Decreased Interest 3  Down, Depressed, Hopeless 2  PHQ - 2 Score 5  Altered sleeping 2  Tired, decreased energy 3  Change in appetite 2  Feeling bad or failure about yourself  0  Trouble concentrating 3  Moving slowly or fidgety/restless 2  Suicidal thoughts 0  PHQ-9 Score 17      Review of Systems:   Pertinent items are noted in HPI Denies any headaches, blurred vision, fatigue, shortness of breath, chest pain, abdominal pain, bowel movements, urination, or intercourse unless otherwise stated above.  Pertinent History Reviewed:  Reviewed past medical,surgical, social and family history.  Reviewed problem list, medications and allergies. Physical Assessment:   Vitals:   04/14/21 1523  BP: 139/86  Pulse:  93  Weight: 168 lb 3.2 oz (76.3 kg)  Height: 5' 2.5" (1.588 m)  Body mass index is 30.27 kg/m.        Physical Examination:   General appearance - well appearing, and in no distress  Mental status - alert, oriented to person, place, and time  Psych:  She has a normal mood and affect  Skin - warm and dry, normal color, no suspicious lesions noted  Chest - effort normal, all lung fields clear to auscultation bilaterally  Heart - normal rate and regular rhythm  Neck:  midline trachea, no thyromegaly or nodules  Breasts - breasts appear normal, no suspicious masses, no skin or nipple changes or  axillary nodes  Abdomen - soft, nontender, nondistended, no masses or organomegaly  Pelvic - VULVA: normal appearing vulva with no masses, tenderness or lesions  VAGINA: normal appearing vagina with normal color and discharge, no lesions  CERVIX: normal appearing cervix without discharge or lesions, no CMT  Thin prep pap is done with HR HPV cotesting  UTERUS: uterus is felt to be normal size, shape, consistency and nontender   ADNEXA: No adnexal masses or tenderness noted.  Extremities:  No swelling or varicosities noted  Chaperone: Network engineer & Plan:  1) Well-Woman Exam -pap collected, reviewed guidelines -mammogram up to date  2) AUB -Reassured pt that this is a normal finding for perimenopause patients -Due to migraines and tobacco use, not a candidate for combined OCPs -Reviewed all options from POPs, TXA, LARCs to surgical intervention -Desires trial of POPs,  f/u in 3 mos or sooner if acute concerns  3) Anxiety -encouraged pt to work on conservative therapy at this time  No orders of the defined types were placed in this encounter.   Meds:  Meds ordered this encounter  Medications   norethindrone (MICRONOR) 0.35 MG tablet    Sig: Take 1 tablet (0.35 mg total) by mouth daily.    Dispense:  90 tablet    Refill:  0    Follow-up: Return in about 3 months (around  07/13/2021) for Medication follow up.   Janyth Pupa, DO Attending [place], [facility] for [employer], [place]

## 2021-04-18 LAB — CYTOLOGY - PAP
Comment: NEGATIVE
Diagnosis: NEGATIVE
High risk HPV: NEGATIVE

## 2021-04-19 ENCOUNTER — Telehealth: Payer: Self-pay

## 2021-04-19 NOTE — Telephone Encounter (Signed)
-----   Message from Janyth Pupa, DO sent at 04/19/2021  7:08 AM EST ----- Pap/HPV negative

## 2021-04-19 NOTE — Telephone Encounter (Signed)
Called pt to relay test results per Dr Nelda Marseille. Two identifiers used. Pt confirmed understanding.

## 2021-05-15 HISTORY — PX: CYST EXCISION: SHX5701

## 2021-05-25 DIAGNOSIS — J3489 Other specified disorders of nose and nasal sinuses: Secondary | ICD-10-CM | POA: Diagnosis not present

## 2021-06-14 DIAGNOSIS — I10 Essential (primary) hypertension: Secondary | ICD-10-CM | POA: Diagnosis not present

## 2021-06-14 DIAGNOSIS — M5136 Other intervertebral disc degeneration, lumbar region: Secondary | ICD-10-CM | POA: Diagnosis not present

## 2021-06-14 DIAGNOSIS — M5416 Radiculopathy, lumbar region: Secondary | ICD-10-CM | POA: Diagnosis not present

## 2021-06-14 DIAGNOSIS — M5412 Radiculopathy, cervical region: Secondary | ICD-10-CM | POA: Diagnosis not present

## 2021-06-14 DIAGNOSIS — F1120 Opioid dependence, uncomplicated: Secondary | ICD-10-CM | POA: Diagnosis not present

## 2021-07-13 ENCOUNTER — Ambulatory Visit: Payer: Managed Care, Other (non HMO) | Admitting: Adult Health

## 2021-07-21 ENCOUNTER — Other Ambulatory Visit: Payer: Self-pay

## 2021-07-21 ENCOUNTER — Ambulatory Visit: Payer: BC Managed Care – PPO | Admitting: Adult Health

## 2021-07-21 ENCOUNTER — Encounter: Payer: Self-pay | Admitting: Adult Health

## 2021-07-21 VITALS — BP 129/73 | HR 86 | Ht 62.5 in | Wt 170.0 lb

## 2021-07-21 DIAGNOSIS — N939 Abnormal uterine and vaginal bleeding, unspecified: Secondary | ICD-10-CM | POA: Diagnosis not present

## 2021-07-21 DIAGNOSIS — N946 Dysmenorrhea, unspecified: Secondary | ICD-10-CM | POA: Diagnosis not present

## 2021-07-21 MED ORDER — MEGESTROL ACETATE 40 MG PO TABS: ORAL_TABLET | ORAL | 1 refills | Status: DC

## 2021-07-21 NOTE — Progress Notes (Signed)
?  Subjective:  ?  ? Patient ID: Natasha King, female   DOB: 04/14/1976, 46 y.o.   MRN: [id number] ? ?HPI ?Natasha King is a 46 year old white female, married, G2P2 back in follow up on starting POP for AUB, and bleeding all over the place she says, has had every 2 weeks and then may bleed for 2 weeks and has pain with periods. ?PCP is Dr Legrand Rams. ?Lab Results  ?Component Value Date  ? DIAGPAP  04/14/2021  ?  - Negative for intraepithelial lesion or malignancy (NILM)  ? [place] Negative 04/14/2021  ?  ?Review of Systems ?Bleeding irregular ?Has pain with periods hands have hurt on POP ? ?Reviewed past medical,surgical, social and family history. Reviewed medications and allergies.  ?   ?Objective:  ? Physical Exam ?BP 129/73 (BP Location: Left Arm, Patient Position: Sitting, Cuff Size: Normal)   Pulse 86   Ht 5' 2.5" (1.588 m)   Wt 170 lb (77.1 kg)   LMP 06/27/2021 (Exact Date)   BMI 30.60 kg/m?   ?  Skin warm and dry. Lungs: clear to ausculation bilaterally. Cardiovascular: regular rate and rhythm.  ? [place] - 07/21/21 1422   ? ?  ? Pregnancy Intention Screening  ? Does the patient want to become pregnant in the next year? No   ? Does the patient's partner want to become pregnant in the next year? No   ? Would the patient like to discuss contraceptive options today? Yes   ?  ? Contraception Wrap Up  ? Current Method Oral Contraceptive   ? End Method Female Condom   ? Contraception Counseling Provided Yes   ? ?  ?  ? ?  ?  ?Assessment:  ?   ?1. Abnormal uterine bleeding ?Stop Micronor now and will start megace in am ?Use condoms if has sex ?Will get pelvic US 2/1/623 at 1:15 pm and then have televisit with me 08/01/21 for ROS  ? ?2. Dysmenorrhea ?Will get [place] to assess uterus and ovaries  ?   ?Plan:  ?  Try to stop smoking ?[place] 07/28/21 ?Televisit 08/01/21 with me  ?   ?

## 2021-07-28 ENCOUNTER — Ambulatory Visit: Payer: BC Managed Care – PPO

## 2021-07-28 ENCOUNTER — Other Ambulatory Visit: Payer: Self-pay

## 2021-08-01 ENCOUNTER — Telehealth: Payer: BC Managed Care – PPO | Admitting: Adult Health

## 2021-08-01 ENCOUNTER — Ambulatory Visit (HOSPITAL_COMMUNITY): Admission: RE | Admit: 2021-08-01 | Payer: BC Managed Care – PPO | Source: Ambulatory Visit

## 2021-08-02 ENCOUNTER — Telehealth: Payer: BC Managed Care – PPO | Admitting: Adult Health

## 2021-08-18 DIAGNOSIS — M5416 Radiculopathy, lumbar region: Secondary | ICD-10-CM | POA: Diagnosis not present

## 2021-08-18 DIAGNOSIS — F1120 Opioid dependence, uncomplicated: Secondary | ICD-10-CM | POA: Diagnosis not present

## 2021-08-18 DIAGNOSIS — M5136 Other intervertebral disc degeneration, lumbar region: Secondary | ICD-10-CM | POA: Diagnosis not present

## 2021-08-18 DIAGNOSIS — M5412 Radiculopathy, cervical region: Secondary | ICD-10-CM | POA: Diagnosis not present

## 2021-09-01 DIAGNOSIS — L7211 Pilar cyst: Secondary | ICD-10-CM | POA: Diagnosis not present

## 2021-09-05 ENCOUNTER — Encounter: Payer: Self-pay | Admitting: Adult Health

## 2021-09-05 ENCOUNTER — Ambulatory Visit: Payer: BC Managed Care – PPO | Admitting: Adult Health

## 2021-09-05 ENCOUNTER — Ambulatory Visit (INDEPENDENT_AMBULATORY_CARE_PROVIDER_SITE_OTHER): Payer: BC Managed Care – PPO

## 2021-09-05 VITALS — BP 126/77 | HR 60 | Ht 62.5 in | Wt 169.0 lb

## 2021-09-05 DIAGNOSIS — N939 Abnormal uterine and vaginal bleeding, unspecified: Secondary | ICD-10-CM | POA: Diagnosis not present

## 2021-09-05 DIAGNOSIS — D250 Submucous leiomyoma of uterus: Secondary | ICD-10-CM | POA: Diagnosis not present

## 2021-09-05 DIAGNOSIS — N840 Polyp of corpus uteri: Secondary | ICD-10-CM | POA: Insufficient documentation

## 2021-09-05 DIAGNOSIS — N946 Dysmenorrhea, unspecified: Secondary | ICD-10-CM

## 2021-09-05 NOTE — Progress Notes (Signed)
?  Subjective:  ?  ? Patient ID: Natasha King, female   DOB: Oct 10, 1975, 46 y.o.   MRN: [id number] ? ?HPI ?Natasha King is a 46 year old white female, married, G2P2, in for GYN [place] to assess uterus and ovaries. She was having periods every 2 weeks and may may bleed for weeks and have, pain, was started on megace but she stopped. She says she does not do progesterone well. ?PCP is Dr Legrand Rams ? ?Review of Systems ?Bleeding every 2 weeks ?Has painful periods  ?Reviewed past medical,surgical, social and family history. Reviewed medications and allergies.  ?   ?Objective:  ? Physical Exam ?BP 126/77 (BP Location: Left Arm, Patient Position: Sitting, Cuff Size: Normal)   Pulse 60   Ht 5' 2.5" (1.588 m)   Wt 169 lb (76.7 kg)   LMP 08/22/2021   BMI 30.42 kg/m?   ?  Skin warm and dry.  Lungs: clear to ausculation bilaterally. Cardiovascular: regular rate and rhythm.  Reviewed [place] with her: has multiple fibroids, largest is subserosal 6 x 4.9 x 5.5 cm and ovaries are normal has simple cyst right adnexa and EEC is 11.6 mm with  9x 5 x 6 mm endometrial polyp  ?Fall risk is moderate ? [facility] - 09/05/21 1053   ? ?  ? Pregnancy Intention Screening  ? Does the patient want to become pregnant in the next year? No   ? Does the patient's partner want to become pregnant in the next year? No   ? Would the patient like to discuss contraceptive options today? No   ?  ? Contraception Wrap Up  ? Current Method Female Condom   ? End Method Female Condom   ? ?  ?  ? ?  ?  ?Assessment:  ?   ?1. Abnormal uterine bleeding ?She stopped megace ?Had bleeding every 2 weeks  ? ?2. Dysmenorrhea ? ?3. Endometrial polyp ?Return 09/14/21 for possible endometrial biopsy with Dr Nelda Marseille ?Did discuss possible sonohysterogram or every D&C and if D&C consider endometrial ablation, handout given ? ?4. Fibroids, submucosal ? ?   ?Plan:  ?   ?Follow up with Dr Nelda Marseille to discuss options  ?   ?

## 2021-09-05 NOTE — Progress Notes (Signed)
PELVIC US TA/TV: heterogeneous anteverted uterus with multiple fibroids,largest fibroids (#1) posterior fundal subserosal fibroid 6 x 4.9 x 5.5 cm,(#2) anterior left intramural fibroid 1.7 x 1 x 1.1 cm,(#3) anterior left intramural fibroid 2.3 x 1.5 x 2.2 cm,EEC 11.6 mm,echogenic mass within the endometrium with color flow (? Polyp) 9 x 5 x 6 mm,normal ovaries,simple right adnexal cyst adjacent to right ovary 3.5 x 1.5 x 2.5 cm,no free fluid,no pain during ultrasound,ovaries appear mobile ? ?Chaperone Peggy ?

## 2021-09-15 DIAGNOSIS — L7211 Pilar cyst: Secondary | ICD-10-CM | POA: Diagnosis not present

## 2021-09-21 ENCOUNTER — Encounter: Payer: Self-pay | Admitting: Obstetrics & Gynecology

## 2021-09-21 ENCOUNTER — Ambulatory Visit: Payer: BC Managed Care – PPO | Admitting: Obstetrics & Gynecology

## 2021-09-21 ENCOUNTER — Other Ambulatory Visit (HOSPITAL_COMMUNITY)
Admission: RE | Admit: 2021-09-21 | Discharge: 2021-09-21 | Disposition: A | Discharge status: Home / Self Care | Payer: BC Managed Care – PPO | Source: Ambulatory Visit | Attending: Obstetrics & Gynecology | Admitting: Obstetrics & Gynecology

## 2021-09-21 VITALS — BP 95/58 | HR 77 | Ht 62.5 in | Wt 164.0 lb

## 2021-09-21 DIAGNOSIS — N858 Other specified noninflammatory disorders of uterus: Secondary | ICD-10-CM | POA: Diagnosis not present

## 2021-09-21 DIAGNOSIS — Z01812 Encounter for preprocedural laboratory examination: Secondary | ICD-10-CM

## 2021-09-21 DIAGNOSIS — D251 Intramural leiomyoma of uterus: Secondary | ICD-10-CM

## 2021-09-21 DIAGNOSIS — N939 Abnormal uterine and vaginal bleeding, unspecified: Secondary | ICD-10-CM | POA: Diagnosis not present

## 2021-09-21 DIAGNOSIS — Z3202 Encounter for pregnancy test, result negative: Secondary | ICD-10-CM

## 2021-09-21 LAB — POCT URINE PREGNANCY: Preg Test, Ur: NEGATIVE

## 2021-09-21 MED ORDER — TRANEXAMIC ACID 650 MG PO TABS: ORAL_TABLET | ORAL | 6 refills | Status: AC

## 2021-09-21 NOTE — Progress Notes (Signed)
? ?GYN VISIT ?Patient name: Natasha King MRN [id number]  Date of birth: [birth year] ?Chief Complaint:   ?Follow-up and Endometrial biopsy ? ?History of Present Illness:   ?Natasha King is a 46 y.o. G56P2002 female being seen today for follow up regarding: ? ?AUB: Seen by J.Griffin 4/24- due to menses every 2 weeks, started on Megace; however, she self discontinued- reports issues with progesterone medication. ?Today she notes that periods have been every 2 weeks for about a year.  Length of menses typically last a full 7 days, typically will have 1-3 heavy day.  On a heavy day will typically change every hour.  When bleeding is moderate every 2-3 hours.  Tried oral progesterone and made things worse. ? ?Denies intermenstrual bleeding.  Occasional dysmenorrhea.  Occasional will take medication, which does help.  No other acute complaints. ? ?[place] reviewed: 9.4cm uterus with several fibroids- largest posterior fundal  6 x 4.9 x 5.5 cm.  Uterine volume 269cc.  Small endometrial polyp.  Normal ovaries bilaterally.    ? ?Patient's last menstrual period was 09/21/2021 (exact date). ? ? ?  04/14/2021  ?  3:18 PM  ?Depression screen PHQ 2/9  ?Decreased Interest 3  ?Down, Depressed, Hopeless 2  ?PHQ - 2 Score 5  ?Altered sleeping 2  ?Tired, decreased energy 3  ?Change in appetite 2  ?Feeling bad or failure about yourself  0  ?Trouble concentrating 3  ?Moving slowly or fidgety/restless 2  ?Suicidal thoughts 0  ?PHQ-9 Score 17  ? ? ? ?Review of Systems:   ?Pertinent items are noted in HPI ?Denies fever/chills, dizziness, headaches, visual disturbances, fatigue, shortness of breath, chest pain, abdominal pain, vomiting, Denies problems with bowel movements, urination, or intercourse unless otherwise stated above.  ?Pertinent History Reviewed:  ?Reviewed past medical,surgical, social, obstetrical and family history.  ?Reviewed problem list, medications and allergies. ?Physical Assessment:  ? ?Vitals:  ? 09/21/21 1514  ?BP:  (!) 95/58  ?Pulse: 77  ?Weight: 164 lb (74.4 kg)  ?Height: 5' 2.5" (1.588 m)  ?Body mass index is 29.52 kg/m?. ? ?     Physical Examination:  ? General appearance: alert, well appearing, and in no distress ? Psych: mood appropriate, normal affect ? Skin: warm & dry  ? Cardiovascular: normal heart rate noted ? Respiratory: normal respiratory effort, no distress ? Abdomen: soft, non-tender  ? Pelvic: VULVA: normal appearing vulva with no masses, tenderness or lesions, VAGINA: normal appearing vagina with normal color and discharge, no lesions, CERVIX: normal appearing cervix without discharge or lesions, currently on menses ? Extremities: no edema  ? ?Chaperone: Glenard Haring [place]   ? ?Endometrial Biopsy Procedure Note ? ?Pre-operative Diagnosis: AUB ? ?Post-operative Diagnosis: same ? ?Procedure Details  ? Urine pregnancy test was negative.  The risks (including infection, bleeding, pain, and uterine perforation) and benefits of the procedure were explained to the patient and Written informed consent was obtained.  Antibiotic prophylaxis against endocarditis was not indicated. ? ? The patient was placed in the dorsal lithotomy position.  Bimanual exam showed the uterus to be in the neutral position.  A speculum inserted in the vagina, and the cervix prepped with betadine.   ? ? A single tooth tenaculum was applied to the anterior lip of the cervix for stabilization.  A Pipelle endometrial aspirator was used to sample the endometrium.  Sample was sent for pathologic examination. ? ?Condition: ?Stable ? ?Complications: ?None ? ? ?Assessment   ?-AUB ?-Intramural fibroids ? ?-Reviewed [place] results ?-Based  on age and AUB, plan for EMB. Inform consent obtained, see procedure note above.  Further management pending path report ?-Discussed management options- pt recnetly quit tobacco so that she may reconsider an estrogen option as she is concerned about taking time away from work for surgical intervention ?-reviewed options including  TXA, progesterone only or hysterectomy ?-discussed limitations of ablation when fibroids present ?-questions/concerns of each option were addressed ?-for now trial of TXA, f/u in 3 mos ? ? ?Orders Placed This Encounter  ?Procedures  ? POCT urine pregnancy  ? ?Meds ordered this encounter  ?Medications  ? tranexamic acid (LYSTEDA) 650 MG TABS tablet  ?  Sig: 1-2 tablets up to 3 times daily while on your period.  Maximum of 5 days  ?  Dispense:  30 tablet  ?  Refill:  6  ? ? ?Return in about 3 months (around 12/22/2021) for Medication follow up, with Dr. Nelda Marseille. ? ? ?Janyth Pupa, DO ?Attending [place], Faculty Practice ?[facility] ? ? ? ?

## 2021-09-23 LAB — SURGICAL PATHOLOGY

## 2021-11-09 DIAGNOSIS — M5412 Radiculopathy, cervical region: Secondary | ICD-10-CM | POA: Diagnosis not present

## 2021-11-09 DIAGNOSIS — F1120 Opioid dependence, uncomplicated: Secondary | ICD-10-CM | POA: Diagnosis not present

## 2021-11-09 DIAGNOSIS — M5136 Other intervertebral disc degeneration, lumbar region: Secondary | ICD-10-CM | POA: Diagnosis not present

## 2021-11-09 DIAGNOSIS — M5416 Radiculopathy, lumbar region: Secondary | ICD-10-CM | POA: Diagnosis not present

## 2022-02-02 DIAGNOSIS — M5136 Other intervertebral disc degeneration, lumbar region: Secondary | ICD-10-CM | POA: Diagnosis not present

## 2022-02-02 DIAGNOSIS — F1120 Opioid dependence, uncomplicated: Secondary | ICD-10-CM | POA: Diagnosis not present

## 2022-02-02 DIAGNOSIS — M5416 Radiculopathy, lumbar region: Secondary | ICD-10-CM | POA: Diagnosis not present

## 2022-02-02 DIAGNOSIS — M5412 Radiculopathy, cervical region: Secondary | ICD-10-CM | POA: Diagnosis not present

## 2022-02-05 IMAGING — DX DG CHEST 2V
2 series · 2 of 2 positions shown · non-contrast
Comparison: None.

CLINICAL DATA: Bronchitis. Cough and congestion.

EXAM:
CHEST - 2 VIEW

[chest pa]
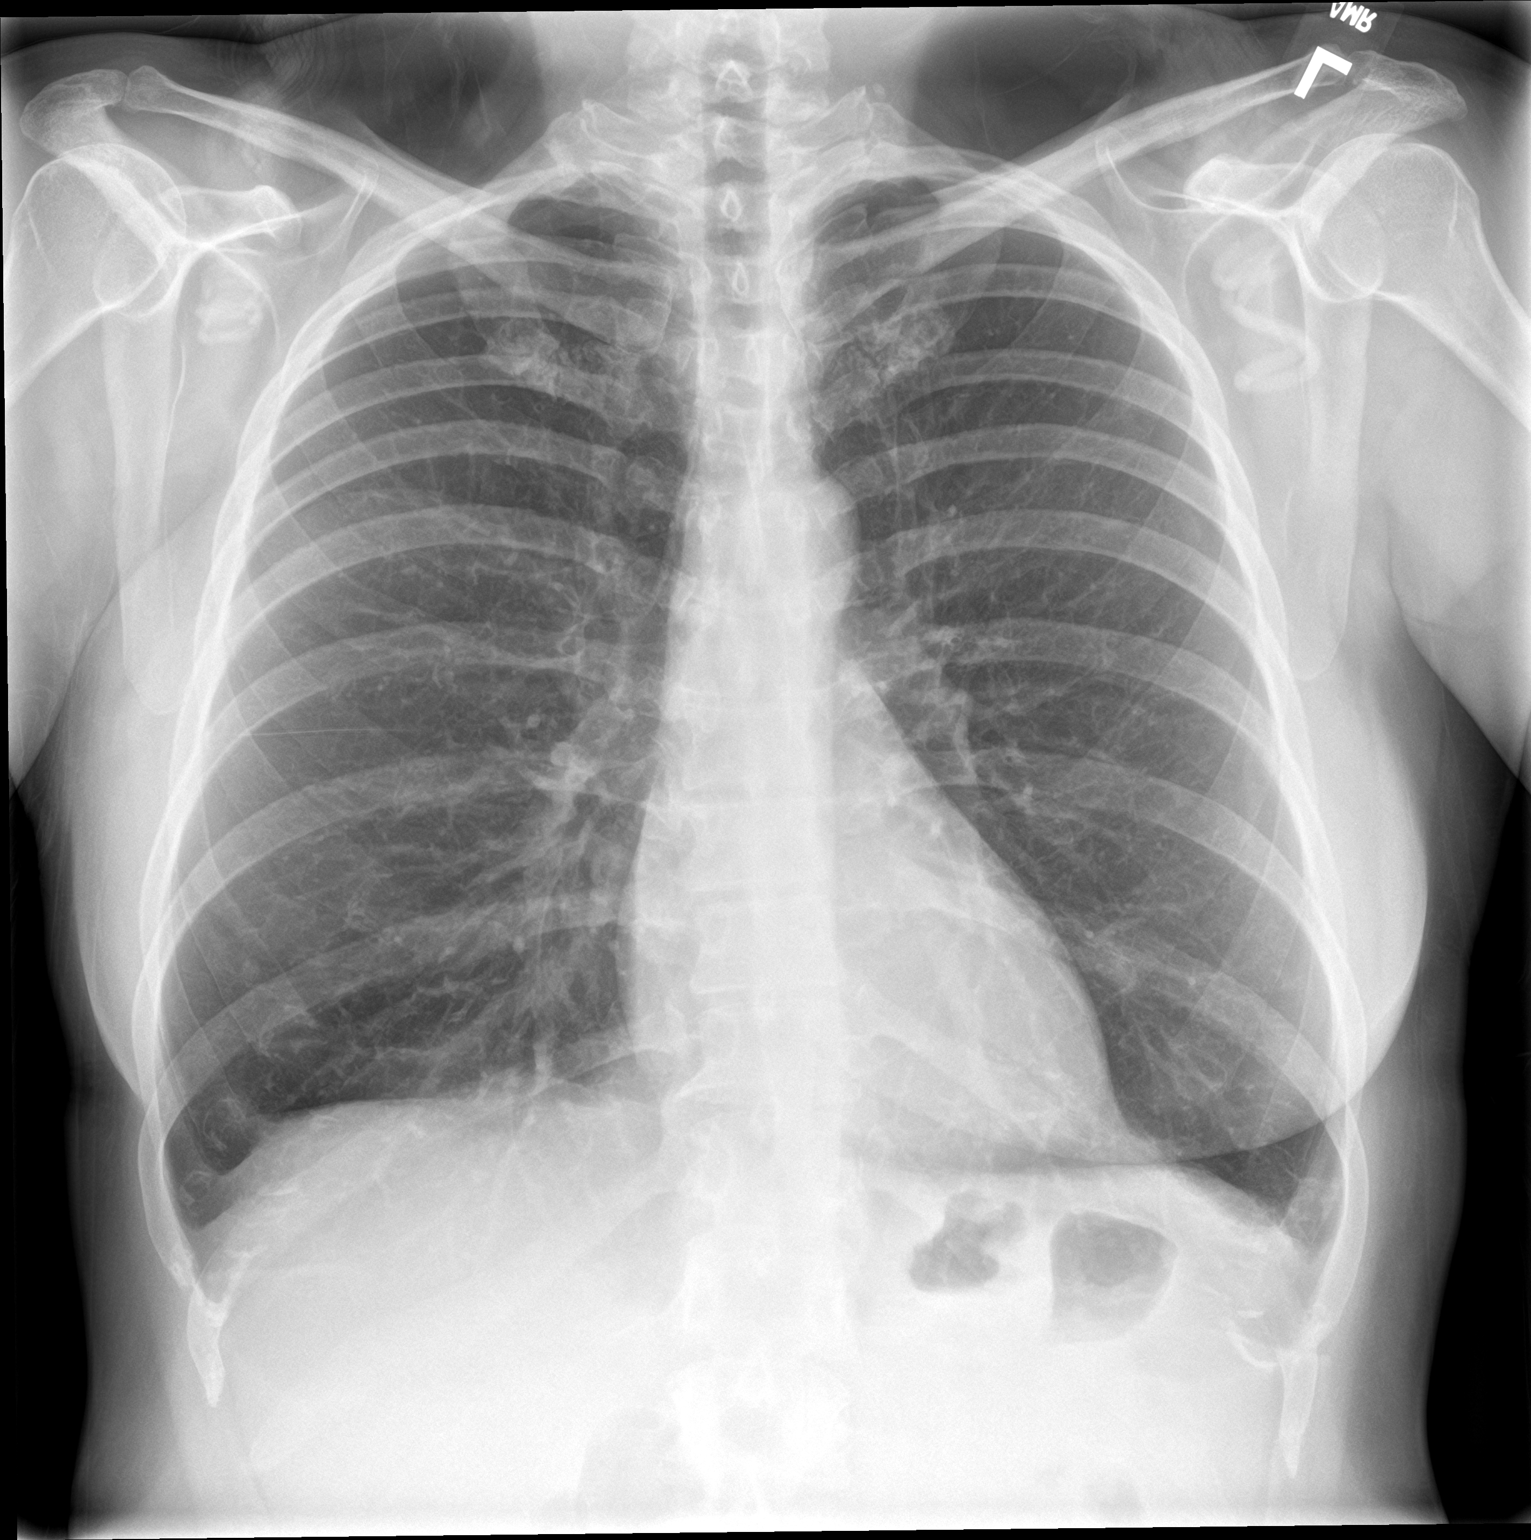

[chest lat]
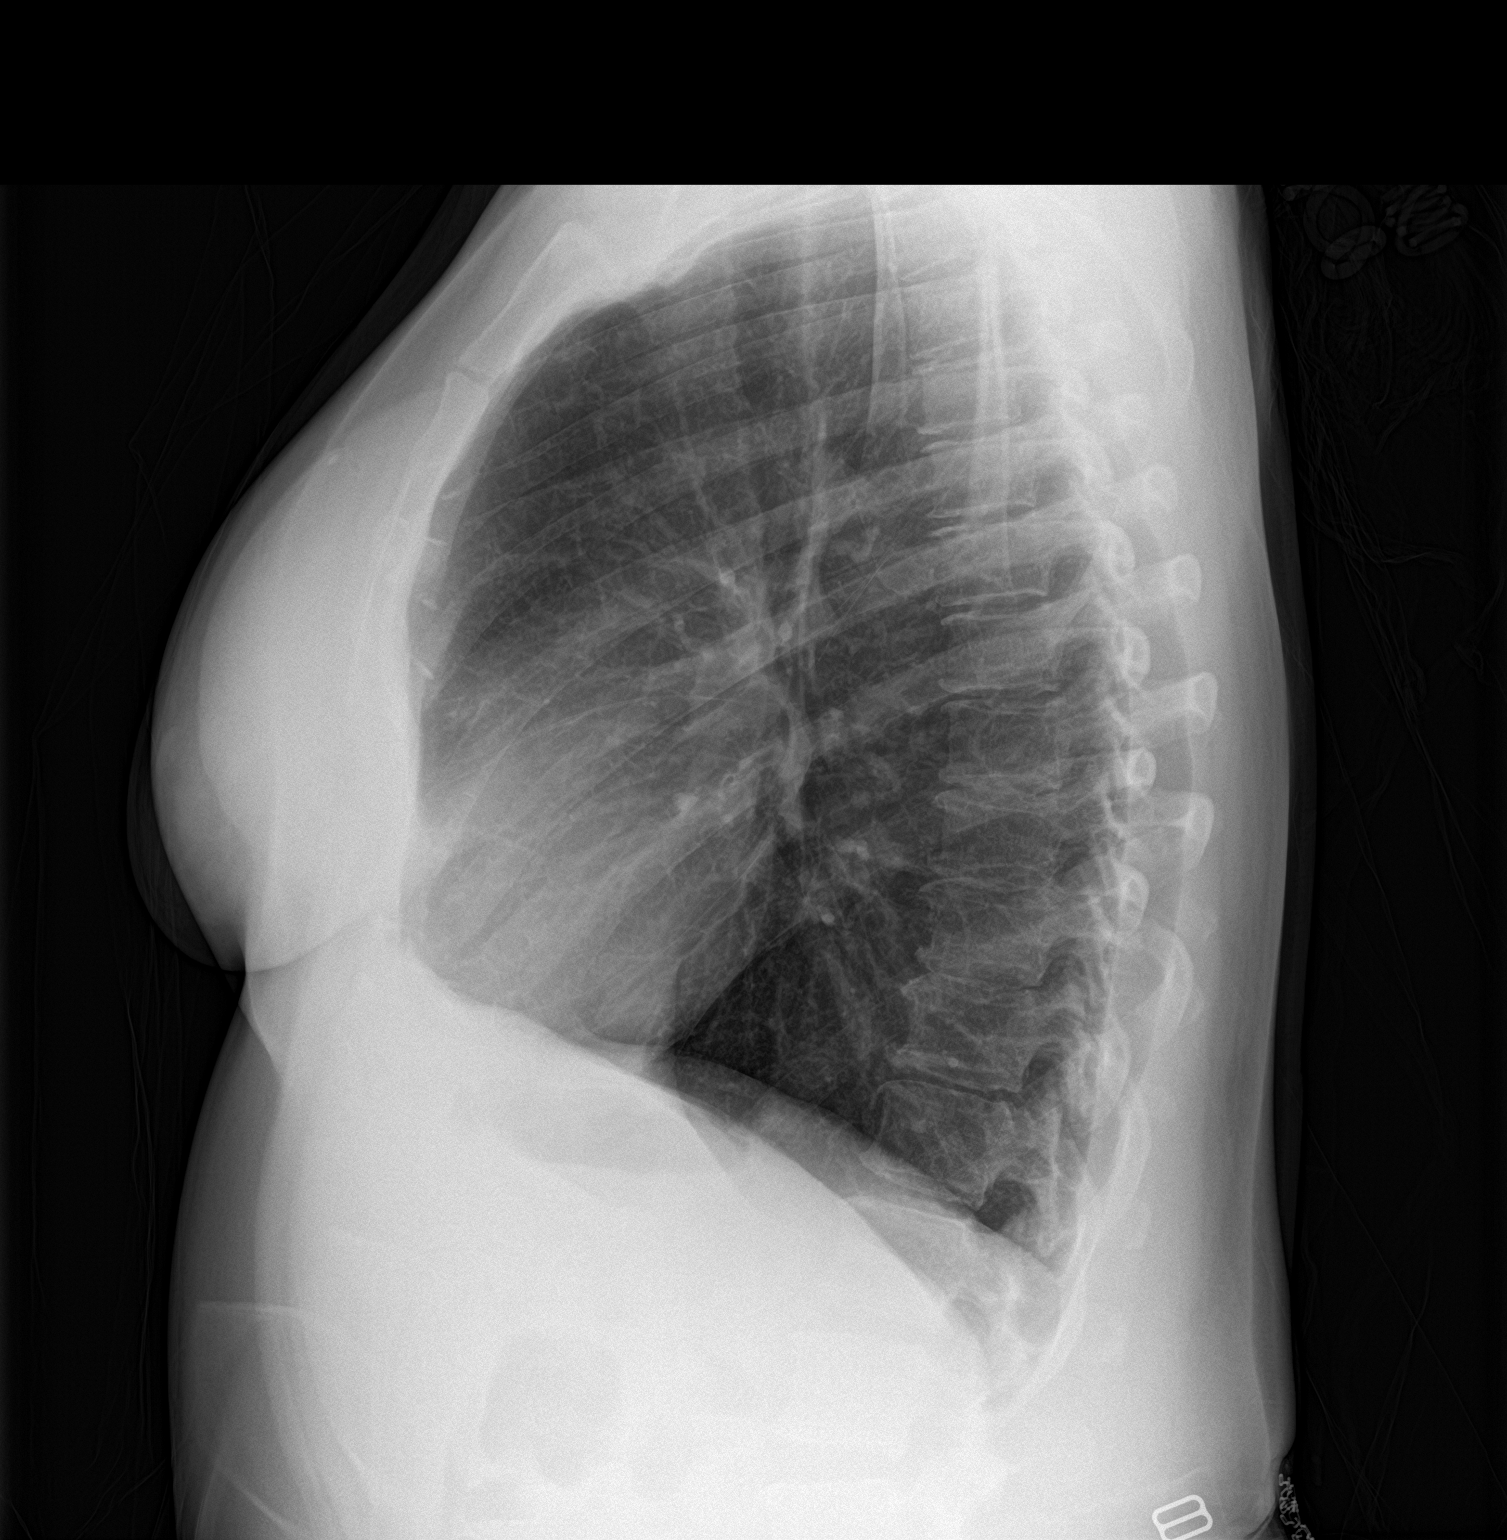

[2 of 2 positions shown; findings below may reference images not displayed]

FINDINGS: The lungs are hyperinflated with mild central bronchial
thickening.The cardiomediastinal contours are normal. Pulmonary
vasculature is normal. No consolidation, pleural effusion, or
pneumothorax. No acute osseous abnormalities are seen. Midthoracic
spondylosis.
IMPRESSION: Hyperinflation with central bronchial thickening, suggesting
bronchitis or asthma.

## 2022-04-27 DIAGNOSIS — M5416 Radiculopathy, lumbar region: Secondary | ICD-10-CM | POA: Diagnosis not present

## 2022-04-27 DIAGNOSIS — M5412 Radiculopathy, cervical region: Secondary | ICD-10-CM | POA: Diagnosis not present

## 2022-04-27 DIAGNOSIS — F1120 Opioid dependence, uncomplicated: Secondary | ICD-10-CM | POA: Diagnosis not present

## 2022-06-01 DIAGNOSIS — M5416 Radiculopathy, lumbar region: Secondary | ICD-10-CM | POA: Diagnosis not present

## 2022-07-11 ENCOUNTER — Ambulatory Visit: Payer: BC Managed Care – PPO | Admitting: Obstetrics & Gynecology

## 2022-08-16 DIAGNOSIS — F17210 Nicotine dependence, cigarettes, uncomplicated: Secondary | ICD-10-CM | POA: Diagnosis not present

## 2022-08-16 DIAGNOSIS — G894 Chronic pain syndrome: Secondary | ICD-10-CM | POA: Diagnosis not present

## 2022-08-16 DIAGNOSIS — F17200 Nicotine dependence, unspecified, uncomplicated: Secondary | ICD-10-CM | POA: Diagnosis not present

## 2022-08-16 DIAGNOSIS — Z0001 Encounter for general adult medical examination with abnormal findings: Secondary | ICD-10-CM | POA: Diagnosis not present

## 2022-08-16 DIAGNOSIS — J42 Unspecified chronic bronchitis: Secondary | ICD-10-CM | POA: Diagnosis not present

## 2022-08-24 DIAGNOSIS — M5412 Radiculopathy, cervical region: Secondary | ICD-10-CM | POA: Diagnosis not present

## 2022-08-24 DIAGNOSIS — F1120 Opioid dependence, uncomplicated: Secondary | ICD-10-CM | POA: Diagnosis not present

## 2022-08-24 DIAGNOSIS — M5416 Radiculopathy, lumbar region: Secondary | ICD-10-CM | POA: Diagnosis not present

## 2022-11-13 DIAGNOSIS — M5136 Other intervertebral disc degeneration, lumbar region: Secondary | ICD-10-CM | POA: Diagnosis not present

## 2022-11-13 DIAGNOSIS — M5416 Radiculopathy, lumbar region: Secondary | ICD-10-CM | POA: Diagnosis not present

## 2022-11-13 DIAGNOSIS — F1120 Opioid dependence, uncomplicated: Secondary | ICD-10-CM | POA: Diagnosis not present

## 2022-11-13 DIAGNOSIS — M5412 Radiculopathy, cervical region: Secondary | ICD-10-CM | POA: Diagnosis not present

## 2023-02-22 DIAGNOSIS — M51362 Other intervertebral disc degeneration, lumbar region with discogenic back pain and lower extremity pain: Secondary | ICD-10-CM | POA: Diagnosis not present

## 2023-02-22 DIAGNOSIS — F1120 Opioid dependence, uncomplicated: Secondary | ICD-10-CM | POA: Diagnosis not present

## 2023-02-22 DIAGNOSIS — M5412 Radiculopathy, cervical region: Secondary | ICD-10-CM | POA: Diagnosis not present

## 2023-02-27 ENCOUNTER — Other Ambulatory Visit (HOSPITAL_COMMUNITY): Payer: Self-pay | Admitting: Gerontology

## 2023-02-27 ENCOUNTER — Ambulatory Visit (HOSPITAL_COMMUNITY)
Admission: RE | Admit: 2023-02-27 | Discharge: 2023-02-27 | Disposition: A | Discharge status: Home / Self Care | Payer: BC Managed Care – PPO | Source: Ambulatory Visit | Attending: Gerontology | Admitting: Gerontology

## 2023-02-27 DIAGNOSIS — F17200 Nicotine dependence, unspecified, uncomplicated: Secondary | ICD-10-CM | POA: Diagnosis not present

## 2023-02-27 DIAGNOSIS — E559 Vitamin D deficiency, unspecified: Secondary | ICD-10-CM | POA: Diagnosis not present

## 2023-02-27 DIAGNOSIS — J410 Simple chronic bronchitis: Secondary | ICD-10-CM | POA: Diagnosis not present

## 2023-02-27 DIAGNOSIS — G894 Chronic pain syndrome: Secondary | ICD-10-CM | POA: Diagnosis not present

## 2023-02-27 DIAGNOSIS — R059 Cough, unspecified: Secondary | ICD-10-CM | POA: Diagnosis not present

## 2023-02-27 DIAGNOSIS — Z0001 Encounter for general adult medical examination with abnormal findings: Secondary | ICD-10-CM | POA: Diagnosis not present

## 2023-02-27 DIAGNOSIS — J42 Unspecified chronic bronchitis: Secondary | ICD-10-CM | POA: Diagnosis not present

## 2023-02-27 DIAGNOSIS — E039 Hypothyroidism, unspecified: Secondary | ICD-10-CM | POA: Diagnosis not present

## 2023-02-27 DIAGNOSIS — F17210 Nicotine dependence, cigarettes, uncomplicated: Secondary | ICD-10-CM | POA: Diagnosis not present

## 2023-05-01 ENCOUNTER — Other Ambulatory Visit: Payer: Self-pay | Admitting: Medical Genetics

## 2023-05-02 ENCOUNTER — Other Ambulatory Visit (HOSPITAL_COMMUNITY): Payer: Self-pay | Attending: Medical Genetics

## 2023-05-25 DIAGNOSIS — M51362 Other intervertebral disc degeneration, lumbar region with discogenic back pain and lower extremity pain: Secondary | ICD-10-CM | POA: Diagnosis not present

## 2023-05-25 DIAGNOSIS — F1120 Opioid dependence, uncomplicated: Secondary | ICD-10-CM | POA: Diagnosis not present

## 2023-05-25 DIAGNOSIS — M5416 Radiculopathy, lumbar region: Secondary | ICD-10-CM | POA: Diagnosis not present

## 2023-08-15 DIAGNOSIS — F1120 Opioid dependence, uncomplicated: Secondary | ICD-10-CM | POA: Diagnosis not present

## 2023-08-15 DIAGNOSIS — M51362 Other intervertebral disc degeneration, lumbar region with discogenic back pain and lower extremity pain: Secondary | ICD-10-CM | POA: Diagnosis not present

## 2023-08-15 DIAGNOSIS — M5412 Radiculopathy, cervical region: Secondary | ICD-10-CM | POA: Diagnosis not present

## 2023-08-15 DIAGNOSIS — M5416 Radiculopathy, lumbar region: Secondary | ICD-10-CM | POA: Diagnosis not present

## 2023-08-30 ENCOUNTER — Other Ambulatory Visit (HOSPITAL_COMMUNITY): Payer: Self-pay | Admitting: Internal Medicine

## 2023-08-30 ENCOUNTER — Encounter: Payer: Self-pay | Admitting: *Deleted

## 2023-08-30 DIAGNOSIS — F17200 Nicotine dependence, unspecified, uncomplicated: Secondary | ICD-10-CM | POA: Diagnosis not present

## 2023-08-30 DIAGNOSIS — Z6830 Body mass index (BMI) 30.0-30.9, adult: Secondary | ICD-10-CM | POA: Diagnosis not present

## 2023-08-30 DIAGNOSIS — F17210 Nicotine dependence, cigarettes, uncomplicated: Secondary | ICD-10-CM | POA: Diagnosis not present

## 2023-08-30 DIAGNOSIS — Z1231 Encounter for screening mammogram for malignant neoplasm of breast: Secondary | ICD-10-CM

## 2023-08-30 DIAGNOSIS — Z0001 Encounter for general adult medical examination with abnormal findings: Secondary | ICD-10-CM | POA: Diagnosis not present

## 2023-08-30 DIAGNOSIS — J42 Unspecified chronic bronchitis: Secondary | ICD-10-CM | POA: Diagnosis not present

## 2023-09-17 ENCOUNTER — Ambulatory Visit (HOSPITAL_COMMUNITY)
Admission: RE | Admit: 2023-09-17 | Discharge: 2023-09-17 | Disposition: A | Discharge status: Home / Self Care | Source: Ambulatory Visit | Attending: Internal Medicine | Admitting: Internal Medicine

## 2023-09-17 DIAGNOSIS — Z0001 Encounter for general adult medical examination with abnormal findings: Secondary | ICD-10-CM | POA: Diagnosis not present

## 2023-09-17 DIAGNOSIS — Z1231 Encounter for screening mammogram for malignant neoplasm of breast: Secondary | ICD-10-CM | POA: Diagnosis not present

## 2023-11-05 DIAGNOSIS — M5412 Radiculopathy, cervical region: Secondary | ICD-10-CM | POA: Diagnosis not present

## 2023-11-05 DIAGNOSIS — F1120 Opioid dependence, uncomplicated: Secondary | ICD-10-CM | POA: Diagnosis not present

## 2023-11-05 DIAGNOSIS — M5416 Radiculopathy, lumbar region: Secondary | ICD-10-CM | POA: Diagnosis not present

## 2024-02-14 DIAGNOSIS — M5412 Radiculopathy, cervical region: Secondary | ICD-10-CM | POA: Diagnosis not present

## 2024-02-14 DIAGNOSIS — F1120 Opioid dependence, uncomplicated: Secondary | ICD-10-CM | POA: Diagnosis not present

## 2024-02-14 DIAGNOSIS — M5416 Radiculopathy, lumbar region: Secondary | ICD-10-CM | POA: Diagnosis not present

## 2024-02-22 DIAGNOSIS — F17200 Nicotine dependence, unspecified, uncomplicated: Secondary | ICD-10-CM | POA: Diagnosis not present

## 2024-02-22 DIAGNOSIS — Z23 Encounter for immunization: Secondary | ICD-10-CM | POA: Diagnosis not present

## 2024-02-22 DIAGNOSIS — G894 Chronic pain syndrome: Secondary | ICD-10-CM | POA: Diagnosis not present

## 2024-02-22 DIAGNOSIS — J42 Unspecified chronic bronchitis: Secondary | ICD-10-CM | POA: Diagnosis not present

## 2024-03-04 ENCOUNTER — Encounter (INDEPENDENT_AMBULATORY_CARE_PROVIDER_SITE_OTHER): Payer: Self-pay | Admitting: *Deleted

## 2024-03-04 ENCOUNTER — Other Ambulatory Visit: Payer: Self-pay | Admitting: Medical Genetics

## 2024-03-04 DIAGNOSIS — Z006 Encounter for examination for normal comparison and control in clinical research program: Secondary | ICD-10-CM
